# Patient Record
Sex: Female | Born: 1935
Health system: Southern US, Community
[De-identification: ages and names within clinical notes are randomized; demographics above are authoritative.]

## PROBLEM LIST (undated history)

## (undated) ENCOUNTER — Emergency Department (HOSPITAL_COMMUNITY): Payer: Medicare HMO

## (undated) DIAGNOSIS — N183 Chronic kidney disease, stage 3 (moderate): Secondary | ICD-10-CM

## (undated) DIAGNOSIS — H3530 Unspecified macular degeneration: Secondary | ICD-10-CM

## (undated) DIAGNOSIS — I10 Essential (primary) hypertension: Secondary | ICD-10-CM

## (undated) DIAGNOSIS — E079 Disorder of thyroid, unspecified: Secondary | ICD-10-CM

## (undated) HISTORY — DX: Disorder of thyroid, unspecified: E07.9

## (undated) HISTORY — DX: Essential (primary) hypertension: I10

## (undated) HISTORY — DX: Unspecified macular degeneration: H35.30

## (undated) HISTORY — PX: FRACTURE SURGERY: SHX138

## (undated) HISTORY — PX: ABDOMINAL HYSTERECTOMY: SHX81

## (undated) HISTORY — PX: EYE SURGERY: SHX253

## (undated) HISTORY — DX: Chronic kidney disease, stage 3 (moderate): N18.3

---

## 2013-10-22 DIAGNOSIS — M8580 Other specified disorders of bone density and structure, unspecified site: Secondary | ICD-10-CM | POA: Insufficient documentation

## 2015-11-02 DIAGNOSIS — K909 Intestinal malabsorption, unspecified: Secondary | ICD-10-CM | POA: Insufficient documentation

## 2015-11-02 DIAGNOSIS — R197 Diarrhea, unspecified: Secondary | ICD-10-CM | POA: Insufficient documentation

## 2016-03-13 DIAGNOSIS — R630 Anorexia: Secondary | ICD-10-CM | POA: Diagnosis not present

## 2016-03-13 DIAGNOSIS — R718 Other abnormality of red blood cells: Secondary | ICD-10-CM | POA: Diagnosis not present

## 2016-03-13 DIAGNOSIS — R432 Parageusia: Secondary | ICD-10-CM | POA: Diagnosis not present

## 2016-03-13 DIAGNOSIS — E039 Hypothyroidism, unspecified: Secondary | ICD-10-CM | POA: Diagnosis not present

## 2016-03-13 DIAGNOSIS — L659 Nonscarring hair loss, unspecified: Secondary | ICD-10-CM | POA: Diagnosis not present

## 2016-03-13 DIAGNOSIS — Z1231 Encounter for screening mammogram for malignant neoplasm of breast: Secondary | ICD-10-CM | POA: Diagnosis not present

## 2016-03-13 DIAGNOSIS — R5383 Other fatigue: Secondary | ICD-10-CM | POA: Diagnosis not present

## 2016-04-20 DIAGNOSIS — Z1231 Encounter for screening mammogram for malignant neoplasm of breast: Secondary | ICD-10-CM | POA: Diagnosis not present

## 2016-07-03 DIAGNOSIS — H04123 Dry eye syndrome of bilateral lacrimal glands: Secondary | ICD-10-CM | POA: Diagnosis not present

## 2016-07-03 DIAGNOSIS — Z961 Presence of intraocular lens: Secondary | ICD-10-CM | POA: Diagnosis not present

## 2016-07-03 DIAGNOSIS — H52223 Regular astigmatism, bilateral: Secondary | ICD-10-CM | POA: Diagnosis not present

## 2016-07-03 DIAGNOSIS — H353130 Nonexudative age-related macular degeneration, bilateral, stage unspecified: Secondary | ICD-10-CM | POA: Diagnosis not present

## 2016-07-03 DIAGNOSIS — H5213 Myopia, bilateral: Secondary | ICD-10-CM | POA: Diagnosis not present

## 2016-07-03 DIAGNOSIS — H26493 Other secondary cataract, bilateral: Secondary | ICD-10-CM | POA: Diagnosis not present

## 2016-10-24 DIAGNOSIS — I10 Essential (primary) hypertension: Secondary | ICD-10-CM | POA: Diagnosis not present

## 2016-10-24 DIAGNOSIS — E7800 Pure hypercholesterolemia, unspecified: Secondary | ICD-10-CM | POA: Diagnosis not present

## 2016-10-24 DIAGNOSIS — E039 Hypothyroidism, unspecified: Secondary | ICD-10-CM | POA: Diagnosis not present

## 2016-10-30 DIAGNOSIS — I10 Essential (primary) hypertension: Secondary | ICD-10-CM | POA: Diagnosis not present

## 2016-10-30 DIAGNOSIS — Z0000 Encounter for general adult medical examination without abnormal findings: Secondary | ICD-10-CM | POA: Diagnosis not present

## 2016-10-31 DIAGNOSIS — H353131 Nonexudative age-related macular degeneration, bilateral, early dry stage: Secondary | ICD-10-CM | POA: Diagnosis not present

## 2016-10-31 DIAGNOSIS — H04123 Dry eye syndrome of bilateral lacrimal glands: Secondary | ICD-10-CM | POA: Diagnosis not present

## 2016-10-31 DIAGNOSIS — Z961 Presence of intraocular lens: Secondary | ICD-10-CM | POA: Diagnosis not present

## 2016-10-31 DIAGNOSIS — H26493 Other secondary cataract, bilateral: Secondary | ICD-10-CM | POA: Diagnosis not present

## 2016-12-01 DIAGNOSIS — R69 Illness, unspecified: Secondary | ICD-10-CM | POA: Diagnosis not present

## 2016-12-11 DIAGNOSIS — Z23 Encounter for immunization: Secondary | ICD-10-CM | POA: Diagnosis not present

## 2016-12-17 DIAGNOSIS — L570 Actinic keratosis: Secondary | ICD-10-CM | POA: Diagnosis not present

## 2016-12-17 DIAGNOSIS — D2372 Other benign neoplasm of skin of left lower limb, including hip: Secondary | ICD-10-CM | POA: Diagnosis not present

## 2016-12-17 DIAGNOSIS — L905 Scar conditions and fibrosis of skin: Secondary | ICD-10-CM | POA: Diagnosis not present

## 2016-12-17 DIAGNOSIS — D1801 Hemangioma of skin and subcutaneous tissue: Secondary | ICD-10-CM | POA: Diagnosis not present

## 2016-12-17 DIAGNOSIS — L814 Other melanin hyperpigmentation: Secondary | ICD-10-CM | POA: Diagnosis not present

## 2016-12-17 DIAGNOSIS — L821 Other seborrheic keratosis: Secondary | ICD-10-CM | POA: Diagnosis not present

## 2016-12-26 DIAGNOSIS — Z23 Encounter for immunization: Secondary | ICD-10-CM | POA: Diagnosis not present

## 2017-01-07 DIAGNOSIS — H353132 Nonexudative age-related macular degeneration, bilateral, intermediate dry stage: Secondary | ICD-10-CM | POA: Diagnosis not present

## 2017-04-23 DIAGNOSIS — Z9981 Dependence on supplemental oxygen: Secondary | ICD-10-CM | POA: Diagnosis not present

## 2017-04-23 DIAGNOSIS — Z8349 Family history of other endocrine, nutritional and metabolic diseases: Secondary | ICD-10-CM | POA: Diagnosis not present

## 2017-04-23 DIAGNOSIS — Z6835 Body mass index (BMI) 35.0-35.9, adult: Secondary | ICD-10-CM | POA: Diagnosis not present

## 2017-04-23 DIAGNOSIS — Z7982 Long term (current) use of aspirin: Secondary | ICD-10-CM | POA: Diagnosis not present

## 2017-04-23 DIAGNOSIS — E669 Obesity, unspecified: Secondary | ICD-10-CM | POA: Diagnosis not present

## 2017-04-23 DIAGNOSIS — Z823 Family history of stroke: Secondary | ICD-10-CM | POA: Diagnosis not present

## 2017-04-23 DIAGNOSIS — Z8249 Family history of ischemic heart disease and other diseases of the circulatory system: Secondary | ICD-10-CM | POA: Diagnosis not present

## 2017-04-23 DIAGNOSIS — I10 Essential (primary) hypertension: Secondary | ICD-10-CM | POA: Diagnosis not present

## 2017-04-23 DIAGNOSIS — E039 Hypothyroidism, unspecified: Secondary | ICD-10-CM | POA: Diagnosis not present

## 2017-05-05 DIAGNOSIS — I10 Essential (primary) hypertension: Secondary | ICD-10-CM | POA: Diagnosis not present

## 2017-05-05 DIAGNOSIS — T7840XA Allergy, unspecified, initial encounter: Secondary | ICD-10-CM | POA: Diagnosis not present

## 2017-05-14 DIAGNOSIS — H353131 Nonexudative age-related macular degeneration, bilateral, early dry stage: Secondary | ICD-10-CM | POA: Diagnosis not present

## 2017-05-14 DIAGNOSIS — Z961 Presence of intraocular lens: Secondary | ICD-10-CM | POA: Diagnosis not present

## 2017-05-14 DIAGNOSIS — H26492 Other secondary cataract, left eye: Secondary | ICD-10-CM | POA: Diagnosis not present

## 2017-05-20 DIAGNOSIS — I10 Essential (primary) hypertension: Secondary | ICD-10-CM | POA: Diagnosis not present

## 2017-05-20 DIAGNOSIS — M25511 Pain in right shoulder: Secondary | ICD-10-CM | POA: Diagnosis not present

## 2017-05-20 DIAGNOSIS — M898X1 Other specified disorders of bone, shoulder: Secondary | ICD-10-CM | POA: Diagnosis not present

## 2017-05-20 DIAGNOSIS — M25819 Other specified joint disorders, unspecified shoulder: Secondary | ICD-10-CM | POA: Diagnosis not present

## 2017-05-20 DIAGNOSIS — M19011 Primary osteoarthritis, right shoulder: Secondary | ICD-10-CM | POA: Diagnosis not present

## 2017-09-22 DIAGNOSIS — L820 Inflamed seborrheic keratosis: Secondary | ICD-10-CM | POA: Diagnosis not present

## 2017-09-22 DIAGNOSIS — L244 Irritant contact dermatitis due to drugs in contact with skin: Secondary | ICD-10-CM | POA: Diagnosis not present

## 2017-09-22 DIAGNOSIS — D485 Neoplasm of uncertain behavior of skin: Secondary | ICD-10-CM | POA: Diagnosis not present

## 2017-09-22 DIAGNOSIS — D0472 Carcinoma in situ of skin of left lower limb, including hip: Secondary | ICD-10-CM | POA: Diagnosis not present

## 2017-09-22 DIAGNOSIS — L281 Prurigo nodularis: Secondary | ICD-10-CM | POA: Diagnosis not present

## 2017-10-13 DIAGNOSIS — D2271 Melanocytic nevi of right lower limb, including hip: Secondary | ICD-10-CM | POA: Diagnosis not present

## 2017-10-13 DIAGNOSIS — D225 Melanocytic nevi of trunk: Secondary | ICD-10-CM | POA: Diagnosis not present

## 2017-10-13 DIAGNOSIS — L570 Actinic keratosis: Secondary | ICD-10-CM | POA: Diagnosis not present

## 2017-10-13 DIAGNOSIS — D2262 Melanocytic nevi of left upper limb, including shoulder: Secondary | ICD-10-CM | POA: Diagnosis not present

## 2017-10-13 DIAGNOSIS — C44722 Squamous cell carcinoma of skin of right lower limb, including hip: Secondary | ICD-10-CM | POA: Diagnosis not present

## 2017-10-13 DIAGNOSIS — C44729 Squamous cell carcinoma of skin of left lower limb, including hip: Secondary | ICD-10-CM | POA: Diagnosis not present

## 2017-10-13 DIAGNOSIS — D2261 Melanocytic nevi of right upper limb, including shoulder: Secondary | ICD-10-CM | POA: Diagnosis not present

## 2017-10-13 DIAGNOSIS — D485 Neoplasm of uncertain behavior of skin: Secondary | ICD-10-CM | POA: Diagnosis not present

## 2017-10-13 DIAGNOSIS — X32XXXA Exposure to sunlight, initial encounter: Secondary | ICD-10-CM | POA: Diagnosis not present

## 2017-10-13 DIAGNOSIS — D2272 Melanocytic nevi of left lower limb, including hip: Secondary | ICD-10-CM | POA: Diagnosis not present

## 2017-10-20 DIAGNOSIS — D0472 Carcinoma in situ of skin of left lower limb, including hip: Secondary | ICD-10-CM | POA: Diagnosis not present

## 2017-10-20 DIAGNOSIS — D0471 Carcinoma in situ of skin of right lower limb, including hip: Secondary | ICD-10-CM | POA: Diagnosis not present

## 2017-12-23 DIAGNOSIS — H43813 Vitreous degeneration, bilateral: Secondary | ICD-10-CM | POA: Diagnosis not present

## 2017-12-23 DIAGNOSIS — H353112 Nonexudative age-related macular degeneration, right eye, intermediate dry stage: Secondary | ICD-10-CM | POA: Diagnosis not present

## 2017-12-24 ENCOUNTER — Ambulatory Visit (INDEPENDENT_AMBULATORY_CARE_PROVIDER_SITE_OTHER): Payer: Medicare HMO | Admitting: Physician Assistant

## 2017-12-24 ENCOUNTER — Encounter: Payer: Self-pay | Admitting: Physician Assistant

## 2017-12-24 VITALS — BP 122/82 | HR 62 | Temp 98.5°F | Resp 16 | Ht 61.0 in | Wt 174.0 lb

## 2017-12-24 DIAGNOSIS — E039 Hypothyroidism, unspecified: Secondary | ICD-10-CM | POA: Diagnosis not present

## 2017-12-24 DIAGNOSIS — Z23 Encounter for immunization: Secondary | ICD-10-CM | POA: Diagnosis not present

## 2017-12-24 DIAGNOSIS — I10 Essential (primary) hypertension: Secondary | ICD-10-CM

## 2017-12-24 MED ORDER — LISINOPRIL-HYDROCHLOROTHIAZIDE 20-12.5 MG PO TABS: 1.0000 | ORAL_TABLET | Freq: Every day | ORAL | 0 refills | Status: DC

## 2017-12-24 NOTE — Progress Notes (Signed)
Patient: Brenda Horton, Female    DOB: May 16, 1929, 84 y.o.   MRN: [id number] Visit Date: 03/09/2018  Today's Provider: Trinna Post, PA-C   Chief Complaint  Patient presents with  . Hypertension  . Hypothyroidism   Subjective:  HPI     New Patient: Brenda Horton is a 84 y.o. female who presents today for new patient appointment. She feels well. The patient has history of hypertension and hypothyroidism. She reports exercising walking. She reports she is sleeping well.  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 Denies fatigue, constipation, weight loss/gain, heat/cold intolerance.   HTN: Takes lisinopril HCTZ 20-12.5 mg daily.  BP Readings from Last 3 Encounters:  03/02/18 135/75  01/13/18 (!) 132/58  12/24/17 122/82      -----------------------------------------------------------------   Review of Systems  Social History She  reports that she has never smoked. She has never used smokeless tobacco. She reports current alcohol use. She reports that she does not use drugs. Social History   Socioeconomic History  . Marital status: Widowed    Spouse name: Not on file  . Number of children: 5  . Years of education: Not on file  . Highest education level: Some college, no degree  Occupational History  . Occupation: retired  Scientific laboratory technician  . Financial resource strain: Not hard at all  . Food insecurity:    Worry: Never true    Inability: Never true  . Transportation needs:    Medical: No    Non-medical: No  Tobacco Use  . Smoking status: Never Smoker  . Smokeless tobacco: Never Used  Substance and Sexual Activity  . Alcohol use: Yes    Frequency: Never    Comment: maybe 2 glasses a month  . Drug use: Never  . Sexual activity: Not on file    Lifestyle  . Physical activity:    Days per week: 0 days    Minutes per session: 0 min  . Stress: Not at all  Relationships  . Social connections:    Talks on phone: Patient refused    Gets together: Patient refused    Attends religious service: Patient refused    Active member of club or organization: Patient refused    Attends meetings of clubs or organizations: Patient refused    Relationship status: Patient refused  Other Topics Concern  . Not on file  Social History Narrative  . Not on file    Patient Active Problem List   Diagnosis Date Noted  . Hypertension 03/09/2018  . Hypothyroidism 03/09/2018    Family History  Family Status  Relation Name Status  . Mother  Deceased  . Father  Deceased  . Daughter  Alive  . Son  Alive  . Son  Alive  . Son  Alive  . Son  Alive   Her family history includes Arthritis in her daughter; Hypertension in her father; Hyperthyroidism in her daughter, mother, and son; Rheum arthritis in her daughter.     No Known Allergies  Previous Medications   ASPIRIN 81 MG TABLET    Take 81 mg by mouth daily.    BIOTIN 2500 MCG CAPS    Take 7,500 mcg by mouth daily.   COENZYME Q10 10 MG CAPSULE  Take 10 mg by mouth daily.    LEVOTHYROXINE (SYNTHROID, LEVOTHROID) 112 MCG TABLET    Take 112 mcg by mouth daily before breakfast.   MULTIPLE VITAMIN (MULTIVITAMIN) CAPSULE    Take 1 capsule by mouth daily.    MULTIPLE VITAMINS-MINERALS (PRESERVISION AREDS 2) CAPS    Take 2 capsules by mouth daily.    PSYLLIUM (METAMUCIL) 58.6 % PACKET    Take 1 packet by mouth daily.     Patient Care Team: Paulene Floor as PCP - General (Physician Assistant) Isaias Sakai, MD as Referring Physician (Ophthalmology)      Objective:   Vitals: BP 122/82 (BP Location: Left Arm, Patient Position: Sitting, Cuff Size: Normal)   Pulse 62   Temp 98.5 F (36.9 C) (Oral)   Resp 16   Ht 5\' 1"  (1.549 m)   Wt 174 lb (78.9 kg)   SpO2 97%   BMI 32.88 kg/m     Physical Exam Constitutional:      Appearance: Normal appearance.  Cardiovascular:     Rate and Rhythm: Normal rate and regular rhythm.  Pulmonary:     Effort: Pulmonary effort is normal.  Skin:    General: Skin is warm and dry.  Neurological:     Mental Status: She is alert.  Psychiatric:        Mood and Affect: Mood normal.      Depression Screen PHQ 2/9 Scores 01/13/2018 12/24/2017 12/24/2017  PHQ - 2 Score 0 0 0      Assessment & Plan:     Routine Health Maintenance and Physical Exam  Exercise Activities and Dietary recommendations Goals    . Exercise 3x per week (30 min per time)     Recommend to start walking 3 days a week for at least 30 minutes at a time.        Immunization History  Administered Date(s) Administered  . Influenza, High Dose Seasonal PF 12/14/2012, 11/24/2014, 12/11/2015, 12/11/2016, 12/24/2017  . Influenza, Seasonal, Injecte, Preservative Fre 12/09/2013  . Pneumococcal Conjugate-13 10/18/2014  . Pneumococcal Polysaccharide-23 03/12/1999  . Tdap 12/26/2016  . Zoster 03/11/2005    Health Maintenance  Topic Date Due  . DEXA SCAN  11/05/2020  . TETANUS/TDAP  12/27/2026  . INFLUENZA VACCINE  Completed  . PNA vac Low Risk Adult  Completed     Discussed health benefits of physical activity, and encouraged her to engage in regular exercise appropriate for her age and condition.    1. Hypertension, unspecified type  - lisinopril-hydrochlorothiazide (ZESTORETIC) 20-12.5 MG tablet; Take 1 tablet by mouth daily.  Dispense: 90 tablet; Refill: 0 - Comprehensive Metabolic Panel (CMET) - Lipid Profile - CBC with Differential  2. Hypothyroidism, unspecified type  Refill synthroid pending lab work.   - TSH  3. Need for influenza vaccination  - Flu vaccine HIGH DOSE PF  Return in about 6 months (around 06/25/2018) for htn f/u and awv .   --------------------------------------------------------------------

## 2017-12-25 LAB — CBC WITH DIFFERENTIAL/PLATELET
Basophils Absolute: 0.1 10*3/uL (ref 0.0–0.2)
Basos: 1 %
EOS (ABSOLUTE): 0.3 10*3/uL (ref 0.0–0.4)
Eos: 4 %
Hematocrit: 39.1 % (ref 34.0–46.6)
Hemoglobin: 12.9 g/dL (ref 11.1–15.9)
Immature Grans (Abs): 0.1 10*3/uL (ref 0.0–0.1)
Immature Granulocytes: 2 %
Lymphocytes Absolute: 2.2 10*3/uL (ref 0.7–3.1)
Lymphs: 39 %
MCH: 33.3 pg — ABNORMAL HIGH (ref 26.6–33.0)
MCHC: 33 g/dL (ref 31.5–35.7)
MCV: 101 fL — ABNORMAL HIGH (ref 79–97)
Monocytes Absolute: 0.7 10*3/uL (ref 0.1–0.9)
Monocytes: 12 %
Neutrophils Absolute: 2.4 10*3/uL (ref 1.4–7.0)
Neutrophils: 42 %
Platelets: 228 10*3/uL (ref 150–450)
RBC: 3.87 x10E6/uL (ref 3.77–5.28)
RDW: 13.9 % (ref 12.3–15.4)
WBC: 5.7 10*3/uL (ref 3.4–10.8)

## 2017-12-25 LAB — COMPREHENSIVE METABOLIC PANEL
ALT: 16 IU/L (ref 0–32)
AST: 17 IU/L (ref 0–40)
Albumin/Globulin Ratio: 2 (ref 1.2–2.2)
Albumin: 4.6 g/dL (ref 3.5–4.7)
Alkaline Phosphatase: 27 IU/L — ABNORMAL LOW (ref 39–117)
BUN/Creatinine Ratio: 29 — ABNORMAL HIGH (ref 12–28)
BUN: 29 mg/dL — ABNORMAL HIGH (ref 8–27)
Bilirubin Total: 0.5 mg/dL (ref 0.0–1.2)
CO2: 24 mmol/L (ref 20–29)
Calcium: 9.6 mg/dL (ref 8.7–10.3)
Chloride: 103 mmol/L (ref 96–106)
Creatinine, Ser: 1 mg/dL (ref 0.57–1.00)
GFR calc Af Amer: 59 mL/min/{1.73_m2} — ABNORMAL LOW (ref >59)
GFR calc non Af Amer: 51 mL/min/{1.73_m2} — ABNORMAL LOW (ref >59)
Globulin, Total: 2.3 g/dL (ref 1.5–4.5)
Glucose: 93 mg/dL (ref 65–99)
Potassium: 4.3 mmol/L (ref 3.5–5.2)
Sodium: 142 mmol/L (ref 134–144)
Total Protein: 6.9 g/dL (ref 6.0–8.5)

## 2017-12-25 LAB — LIPID PANEL
Chol/HDL Ratio: 3 ratio (ref 0.0–4.4)
Cholesterol, Total: 196 mg/dL (ref 100–199)
HDL: 66 mg/dL (ref >39)
LDL Calculated: 106 mg/dL — ABNORMAL HIGH (ref 0–99)
Triglycerides: 119 mg/dL (ref 0–149)
VLDL Cholesterol Cal: 24 mg/dL (ref 5–40)

## 2017-12-25 LAB — TSH: TSH: 0.909 u[IU]/mL (ref 0.450–4.500)

## 2018-01-08 ENCOUNTER — Telehealth: Payer: Self-pay

## 2018-01-08 NOTE — Telephone Encounter (Signed)
Patient advised as below. Will wait to have labs rechecked till April.

## 2018-01-08 NOTE — Telephone Encounter (Signed)
-----   Message from Trinna Post, [place] sent at 01/08/2018 11:08 AM EDT ----- TSH normal. Kidney function slightly reduced but stable. Cholesterol well controlled. CBC shows slightly large blood cells which have been present for 4 years base on her previous labwork. Sometimes this can be due to vitamin deficiencies. Her B12 was normal last year, folate not tested. Would she like me to test again? If so can place labwork upfront for her to come get.

## 2018-01-08 NOTE — Telephone Encounter (Signed)
na

## 2018-01-09 ENCOUNTER — Telehealth: Payer: Self-pay | Admitting: Physician Assistant

## 2018-01-09 NOTE — Telephone Encounter (Signed)
Pt called wanting Adriana to call her back to talk about her blood pressure medication and if it will effect her kidneys  CB#  [phone]  Thanks  teri

## 2018-01-09 NOTE — Telephone Encounter (Signed)
The medication she is on can sometimes affect her kidneys but I have reviewed her labwork of her kidney function as far back as 2013 and it is stable so I do not think this is the cause.

## 2018-01-12 NOTE — Telephone Encounter (Signed)
Patient advised as below.  

## 2018-01-13 ENCOUNTER — Ambulatory Visit: Payer: Medicare HMO

## 2018-01-13 VITALS — BP 132/58 | HR 68 | Temp 97.9°F | Ht 61.0 in | Wt 180.0 lb

## 2018-01-13 DIAGNOSIS — Z0000 Encounter for general adult medical examination without abnormal findings: Secondary | ICD-10-CM | POA: Diagnosis not present

## 2018-01-13 NOTE — Progress Notes (Signed)
Subjective:   Brenda Horton is a 84 y.o. female who presents for an Initial Medicare Annual Wellness Visit.  Review of Systems    N/A  Cardiac Risk Factors include: advanced age (>52men, >73 women);hypertension;obesity (BMI >30kg/m2)     Objective:    Today's Vitals   01/13/18 0833 01/13/18 0839  BP: (!) 132/58   Pulse: 68   Temp: 97.9 F (36.6 C)   TempSrc: Oral   Weight: 180 lb (81.6 kg)   Height: 5\' 1"  (1.549 m)   PainSc: 0-No pain 0-No pain   Body mass index is 34.01 kg/m.  Advanced Directives 01/13/2018  Does Patient Have a Medical Advance Directive? Yes  Type of Paramedic of [place];Living will  Copy of [place] in Chart? Yes    Current Medications (verified) Outpatient Encounter Medications as of 01/13/2018  Medication Sig  . aspirin 81 MG tablet Take 81 mg by mouth daily.   . Biotin 2500 MCG CAPS Take 7,500 mcg by mouth daily.  . Coenzyme Q10 10 MG capsule Take 10 mg by mouth daily.   Marland Kitchen levothyroxine (SYNTHROID, LEVOTHROID) 112 MCG tablet Take 112 mcg by mouth daily before breakfast.  . lisinopril-hydrochlorothiazide (ZESTORETIC) 20-12.5 MG tablet Take 1 tablet by mouth daily.  . Multiple Vitamin (MULTIVITAMIN) capsule Take 1 capsule by mouth daily.   . Multiple Vitamins-Minerals (PRESERVISION AREDS 2) CAPS Take 2 capsules by mouth daily.   . psyllium (METAMUCIL) 58.6 % packet Take 1 packet by mouth daily.    No facility-administered encounter medications on file as of 01/13/2018.     Allergies (verified) Patient has no known allergies.   History: Past Medical History:  Diagnosis Date  . Hypertension   . Macular degeneration   . Thyroid disease    Past Surgical History:  Procedure Laterality Date  . ABDOMINAL HYSTERECTOMY     Partial  . EYE SURGERY    . FRACTURE SURGERY     Family History  Problem Relation Age of Onset  . Hyperthyroidism Mother   . Hypertension Father   . Hyperthyroidism  Daughter   . Arthritis Daughter   . Rheum arthritis Daughter   . Hyperthyroidism Son    Social History   Socioeconomic History  . Marital status: Widowed    Spouse name: Not on file  . Number of children: 5  . Years of education: Not on file  . Highest education level: Some college, no degree  Occupational History  . Occupation: retired  Scientific laboratory technician  . Financial resource strain: Not hard at all  . Food insecurity:    Worry: Never true    Inability: Never true  . Transportation needs:    Medical: No    Non-medical: No  Tobacco Use  . Smoking status: Never Smoker  . Smokeless tobacco: Never Used  Substance and Sexual Activity  . Alcohol use: Yes    Frequency: Never    Comment: maybe 2 glasses a month  . Drug use: Never  . Sexual activity: Not on file  Lifestyle  . Physical activity:    Days per week: 0 days    Minutes per session: 0 min  . Stress: Not at all  Relationships  . Social connections:    Talks on phone: Patient refused    Gets together: Patient refused    Attends religious service: Patient refused    Active member of club or organization: Patient refused    Attends meetings of clubs or organizations:  Patient refused    Relationship status: Patient refused  Other Topics Concern  . Not on file  Social History Narrative  . Not on file    Tobacco Counseling Counseling given: Not Answered   Clinical Intake:  Pre-visit preparation completed: Yes  Pain : No/denies pain Pain Score: 0-No pain     Nutritional Status: BMI > 30  Obese Nutritional Risks: None Diabetes: No  How often do you need to have someone help you when you read instructions, pamphlets, or other written materials from your doctor or pharmacy?: 1 - Never  Interpreter Needed?: No  Information entered by :: [facility], LPN   Activities of Daily Living In your present state of health, do you have any difficulty performing the following activities: 01/13/2018 12/24/2017  Hearing?  N N  Vision? N N  Difficulty concentrating or making decisions? N N  Walking or climbing stairs? N N  Dressing or bathing? N N  Doing errands, shopping? N N  Preparing Food and eating ? N -  Using the Toilet? N -  In the past six months, have you accidently leaked urine? Y -  Comment Occasionally, wears pads for protection.  -  Do you have problems with loss of bowel control? N -  Managing your Medications? N -  Managing your Finances? N -  Housekeeping or managing your Housekeeping? N -     Immunizations and Health Maintenance Immunization History  Administered Date(s) Administered  . Influenza, High Dose Seasonal PF 12/14/2012, 11/24/2014, 12/11/2015, 12/11/2016, 12/24/2017  . Influenza, Seasonal, Injecte, Preservative Fre 12/09/2013  . Pneumococcal Conjugate-13 10/18/2014  . Pneumococcal Polysaccharide-23 03/12/1999  . Tdap 12/26/2016  . Zoster 03/11/2005   There are no preventive care reminders to display for this patient.  Patient Care Team: Paulene Floor as PCP - General (Physician Assistant) Isaias Sakai, MD as Referring Physician (Ophthalmology)  Indicate any recent Medical Services you may have received from other than [facility] providers in the past year (date may be approximate).     Assessment:   This is a routine wellness examination for [place].  Hearing/Vision screen No exam data present  Dietary issues and exercise activities discussed: Current Exercise Habits: The patient does not participate in regular exercise at present, Exercise limited by: None identified  Goals    . Exercise 3x per week (30 min per time)     Recommend to start walking 3 days a week for at least 30 minutes at a time.       Depression Screen PHQ 2/9 Scores 01/13/2018 12/24/2017 12/24/2017  PHQ - 2 Score 0 0 0    Fall Risk Fall Risk  01/13/2018 12/24/2017 12/24/2017  Falls in the past year? 0 No No    FALL RISK PREVENTION PERTAINING TO THE HOME:  Any stairs in or  around the home WITH handrails? No  Home free of loose throw rugs in walkways, pet beds, electrical cords, etc? Yes  Adequate lighting in your home to reduce risk of falls? Yes   ASSISTIVE DEVICES UTILIZED TO PREVENT FALLS:  Life alert? No  Use of a cane, walker or w/c? No  Grab bars in the bathroom? Yes  Shower chair or bench in shower? No  Elevated toilet seat or a handicapped toilet? Yes    TIMED UP AND GO:  Was the test performed? No .     Cognitive Function: Declined today.         Screening Tests Health Maintenance  Topic Date Due  .  DEXA SCAN  11/05/2020  . TETANUS/TDAP  12/27/2026  . INFLUENZA VACCINE  Completed  . PNA vac Low Risk Adult  Completed    Qualifies for Shingles Vaccine? Yes  Zostavax completed in 2007. Due for Shingrix. Education has been provided regarding the importance of this vaccine. Pt has been advised to call insurance company to determine out of pocket expense. Advised may also receive vaccine at local pharmacy or Health Dept. Verbalized acceptance and understanding.  Tdap: Up to date  Flu Vaccine: Up to date  Pneumococcal Vaccine: Up to date   Cancer Screenings:  Colorectal Screening: No longer required.  Mammogram: No longer required.  Bone Density: Completed 11/06/15. Results reflect OSTEOPENIA. Repeat every 5 years.   Lung Cancer Screening: (Low Dose CT Chest recommended if Age 54-80 years, 30 pack-year currently smoking OR have quit w/in 15years.) does not qualify.    Additional Screening:  Vision Screening: Recommended annual ophthalmology exams for early detection of glaucoma and other disorders of the eye. Dental Screening: Recommended annual dental exams for proper oral hygiene  Community Resource Referral:  CRR required this visit?  No       Plan:  I have personally reviewed and addressed the Medicare Annual Wellness questionnaire and have noted the following in the patient's chart:  A. Medical and social  history B. Use of alcohol, tobacco or illicit drugs  C. Current medications and supplements D. Functional ability and status E.  Nutritional status F.  Physical activity G. Advance directives H. List of other physicians I.  Hospitalizations, surgeries, and ER visits in previous 12 months J.  [place] such as hearing and vision if needed, cognitive and depression L. Referrals and appointments - none  In addition, I have reviewed and discussed with patient certain preventive protocols, quality metrics, and best practice recommendations. A written personalized care plan for preventive services as well as general preventive health recommendations were provided to patient.  See attached scanned questionnaire for additional information.   Signed,  Fabio Neighbors, LPN Nurse Health Advisor   Nurse Recommendations: None.

## 2018-01-13 NOTE — Patient Instructions (Addendum)
Brenda Horton , Thank you for taking time to come for your Medicare Wellness Visit. I appreciate your ongoing commitment to your health goals. Please review the following plan we discussed and let me know if I can assist you in the future.   Screening recommendations/referrals: Colonoscopy: N/A Mammogram: N/A Bone Density: Up to date Recommended yearly ophthalmology/optometry visit for glaucoma screening and checkup Recommended yearly dental visit for hygiene and checkup  Vaccinations: Influenza vaccine: Up to date Pneumococcal vaccine: Up to date Tdap vaccine: Up to date Shingles vaccine: Pt declines today.     Advanced directives: Copied into file today.   Conditions/risks identified: Obesity- recommend to start walking 3 days a week for at least 30 minutes at a time.   Next appointment: 06/24/17 with Carles Collet. Pt to CB and schedule a CPE this year.    Preventive Care 82 Years and Older, Female Preventive care refers to lifestyle choices and visits with your health care provider that can promote health and wellness. What does preventive care include?  A yearly physical exam. This is also called an annual well check.  Dental exams once or twice a year.  Routine eye exams. Ask your health care provider how often you should have your eyes checked.  Personal lifestyle choices, including:  Daily care of your teeth and gums.  Regular physical activity.  Eating a healthy diet.  Avoiding tobacco and drug use.  Limiting alcohol use.  Practicing safe sex.  Taking low-dose aspirin every day.  Taking vitamin and mineral supplements as recommended by your health care provider. What happens during an annual well check? The services and screenings done by your health care provider during your annual well check will depend on your age, overall health, lifestyle risk factors, and family history of disease. Counseling  Your health care provider may ask you questions about  your:  Alcohol use.  Tobacco use.  Drug use.  Emotional well-being.  Home and relationship well-being.  Sexual activity.  Eating habits.  History of falls.  Memory and ability to understand (cognition).  Work and work Statistician.  Reproductive health. Screening  You may have the following tests or measurements:  Height, weight, and BMI.  Blood pressure.  Lipid and cholesterol levels. These may be checked every 5 years, or more frequently if you are over 27 years old.  Skin check.  Lung cancer screening. You may have this screening every year starting at age 23 if you have a 30-pack-year history of smoking and currently smoke or have quit within the past 15 years.  Fecal occult blood test (FOBT) of the stool. You may have this test every year starting at age 28.  Flexible sigmoidoscopy or colonoscopy. You may have a sigmoidoscopy every 5 years or a colonoscopy every 10 years starting at age 12.  Hepatitis C blood test.  Hepatitis B blood test.  Sexually transmitted disease (STD) testing.  Diabetes screening. This is done by checking your blood sugar (glucose) after you have not eaten for a while (fasting). You may have this done every 1-3 years.  Bone density scan. This is done to screen for osteoporosis. You may have this done starting at age 6.  Mammogram. This may be done every 1-2 years. Talk to your health care provider about how often you should have regular mammograms. Talk with your health care provider about your test results, treatment options, and if necessary, the need for more tests. Vaccines  Your health care provider may recommend certain vaccines,  such as:  Influenza vaccine. This is recommended every year.  Tetanus, diphtheria, and acellular pertussis (Tdap, Td) vaccine. You may need a Td booster every 10 years.  Zoster vaccine. You may need this after age 72.  Pneumococcal 13-valent conjugate (PCV13) vaccine. One dose is recommended  after age 50.  Pneumococcal polysaccharide (PPSV23) vaccine. One dose is recommended after age 48. Talk to your health care provider about which screenings and vaccines you need and how often you need them. This information is not intended to replace advice given to you by your health care provider. Make sure you discuss any questions you have with your health care provider. Document Released: 03/24/2015 Document Revised: 11/15/2015 Document Reviewed: 12/27/2014 Elsevier Interactive Patient Education  2017 [place] Prevention in the Home Falls can cause injuries. They can happen to people of all ages. There are many things you can do to make your home safe and to help prevent falls. What can I do on the outside of my home?  Regularly fix the edges of walkways and driveways and fix any cracks.  Remove anything that might make you trip as you walk through a door, such as a raised step or threshold.  Trim any bushes or trees on the path to your home.  Use bright outdoor lighting.  Clear any walking paths of anything that might make someone trip, such as rocks or tools.  Regularly check to see if handrails are loose or broken. Make sure that both sides of any steps have handrails.  Any raised decks and porches should have guardrails on the edges.  Have any leaves, snow, or ice cleared regularly.  Use sand or salt on walking paths during winter.  Clean up any spills in your garage right away. This includes oil or grease spills. What can I do in the bathroom?  Use night lights.  Install grab bars by the toilet and in the tub and shower. Do not use towel bars as grab bars.  Use non-skid mats or decals in the tub or shower.  If you need to sit down in the shower, use a plastic, non-slip stool.  Keep the floor dry. Clean up any water that spills on the floor as soon as it happens.  Remove soap buildup in the tub or shower regularly.  Attach bath mats securely with  double-sided non-slip rug tape.  Do not have throw rugs and other things on the floor that can make you trip. What can I do in the bedroom?  Use night lights.  Make sure that you have a light by your bed that is easy to reach.  Do not use any sheets or blankets that are too big for your bed. They should not hang down onto the floor.  Have a firm chair that has side arms. You can use this for support while you get dressed.  Do not have throw rugs and other things on the floor that can make you trip. What can I do in the kitchen?  Clean up any spills right away.  Avoid walking on wet floors.  Keep items that you use a lot in easy-to-reach places.  If you need to reach something above you, use a strong step stool that has a grab bar.  Keep electrical cords out of the way.  Do not use floor polish or wax that makes floors slippery. If you must use wax, use non-skid floor wax.  Do not have throw rugs and other things on  the floor that can make you trip. What can I do with my stairs?  Do not leave any items on the stairs.  Make sure that there are handrails on both sides of the stairs and use them. Fix handrails that are broken or loose. Make sure that handrails are as long as the stairways.  Check any carpeting to make sure that it is firmly attached to the stairs. Fix any carpet that is loose or worn.  Avoid having throw rugs at the top or bottom of the stairs. If you do have throw rugs, attach them to the floor with carpet tape.  Make sure that you have a light switch at the top of the stairs and the bottom of the stairs. If you do not have them, ask someone to add them for you. What else can I do to help prevent falls?  Wear shoes that:  Do not have high heels.  Have rubber bottoms.  Are comfortable and fit you well.  Are closed at the toe. Do not wear sandals.  If you use a stepladder:  Make sure that it is fully opened. Do not climb a closed stepladder.  Make  sure that both sides of the stepladder are locked into place.  Ask someone to hold it for you, if possible.  Clearly mark and make sure that you can see:  Any grab bars or handrails.  First and last steps.  Where the edge of each step is.  Use tools that help you move around (mobility aids) if they are needed. These include:  Canes.  Walkers.  Scooters.  Crutches.  Turn on the lights when you go into a dark area. Replace any light bulbs as soon as they burn out.  Set up your furniture so you have a clear path. Avoid moving your furniture around.  If any of your floors are uneven, fix them.  If there are any pets around you, be aware of where they are.  Review your medicines with your doctor. Some medicines can make you feel dizzy. This can increase your chance of falling. Ask your doctor what other things that you can do to help prevent falls. This information is not intended to replace advice given to you by your health care provider. Make sure you discuss any questions you have with your health care provider. Document Released: 12/22/2008 Document Revised: 08/03/2015 Document Reviewed: 04/01/2014 Elsevier Interactive Patient Education  2017 [employer].

## 2018-01-14 ENCOUNTER — Ambulatory Visit: Payer: Self-pay | Admitting: Physician Assistant

## 2018-02-02 DIAGNOSIS — H04123 Dry eye syndrome of bilateral lacrimal glands: Secondary | ICD-10-CM | POA: Diagnosis not present

## 2018-03-02 ENCOUNTER — Encounter: Payer: Self-pay | Admitting: Physician Assistant

## 2018-03-02 ENCOUNTER — Ambulatory Visit (INDEPENDENT_AMBULATORY_CARE_PROVIDER_SITE_OTHER): Payer: Medicare HMO | Admitting: Physician Assistant

## 2018-03-02 VITALS — BP 135/75 | HR 71 | Temp 98.8°F | Wt 180.8 lb

## 2018-03-02 DIAGNOSIS — I10 Essential (primary) hypertension: Secondary | ICD-10-CM | POA: Diagnosis not present

## 2018-03-02 DIAGNOSIS — D7589 Other specified diseases of blood and blood-forming organs: Secondary | ICD-10-CM

## 2018-03-02 DIAGNOSIS — Z0000 Encounter for general adult medical examination without abnormal findings: Secondary | ICD-10-CM

## 2018-03-02 DIAGNOSIS — N183 Chronic kidney disease, stage 3 unspecified: Secondary | ICD-10-CM

## 2018-03-02 DIAGNOSIS — E039 Hypothyroidism, unspecified: Secondary | ICD-10-CM

## 2018-03-02 NOTE — Progress Notes (Signed)
Patient: Brenda Horton, Female    DOB: 08-09-1929, 84 y.o.   MRN: [id number] Visit Date: 03/09/2018  Today's Provider: Trinna Post, PA-C   Chief Complaint  Patient presents with  . Annual Exam   Subjective:   AWE with McKenzie was on 01/13/2018.   Annual wellness visit Brenda Horton is a 84 y.o. female. She feels well. She reports exercising includes walking. She reports she is sleeping well.  Declines DEXA: mammogram not indicated, colonoscopy not indicaed shingrix - never had shingles, she does remember having zostavax - will look into insurance ckd - htn kidney disease 2013: history of c diff infection and has had intermittent diarrhea since then.  Macrocytosis: Has a glass of wine occasionally during the week.   Lab Results  Component Value Date   WBC 7.0 03/02/2018   HGB 12.3 03/02/2018   HCT 36.8 03/02/2018   MCV 103 (H) 03/02/2018   PLT 195 03/02/2018    -----------------------------------------------------------   Review of Systems  Constitutional: Negative.   HENT: Positive for sinus pressure and tinnitus.   Eyes: Positive for photophobia.  Respiratory: Negative.   Cardiovascular: Negative.   Gastrointestinal: Positive for diarrhea.  Endocrine: Negative.   Genitourinary: Negative.   Musculoskeletal: Negative.   Skin: Negative.   Allergic/Immunologic: Negative.   Neurological: Negative.   Hematological: Negative.   Psychiatric/Behavioral: Negative.     Social History   Socioeconomic History  . Marital status: Widowed    Spouse name: Not on file  . Number of children: 5  . Years of education: Not on file  . Highest education level: Some college, no degree  Occupational History  . Occupation: retired  Scientific laboratory technician  . Financial resource strain: Not hard at all  . Food insecurity:    Worry: Never true    Inability: Never true  . Transportation needs:    Medical: No    Non-medical: No  Tobacco Use  . Smoking status: Never  Smoker  . Smokeless tobacco: Never Used  Substance and Sexual Activity  . Alcohol use: Yes    Frequency: Never    Comment: maybe 2 glasses a month  . Drug use: Never  . Sexual activity: Not on file  Lifestyle  . Physical activity:    Days per week: 0 days    Minutes per session: 0 min  . Stress: Not at all  Relationships  . Social connections:    Talks on phone: Patient refused    Gets together: Patient refused    Attends religious service: Patient refused    Active member of club or organization: Patient refused    Attends meetings of clubs or organizations: Patient refused    Relationship status: Patient refused  . Intimate partner violence:    Fear of current or ex partner: Patient refused    Emotionally abused: Patient refused    Physically abused: Patient refused    Forced sexual activity: Patient refused  Other Topics Concern  . Not on file  Social History Narrative  . Not on file    Past Medical History:  Diagnosis Date  . CKD (chronic kidney disease) stage 3, GFR 30-59 ml/min (HCC) 03/09/2018  . Hypertension   . Macular degeneration   . Thyroid disease      Patient Active Problem List   Diagnosis Date Noted  . Hypertension 03/09/2018  . Hypothyroidism 03/09/2018  . CKD (chronic kidney disease) stage 3, GFR 30-59 ml/min (HCC) 03/09/2018    Past  Surgical History:  Procedure Laterality Date  . ABDOMINAL HYSTERECTOMY     Partial  . EYE SURGERY    . FRACTURE SURGERY      Her family history includes Arthritis in her daughter; Hypertension in her father; Hyperthyroidism in her daughter, mother, and son; Rheum arthritis in her daughter.      Current Outpatient Medications:  .  aspirin 81 MG tablet, Take 81 mg by mouth daily. , Disp: , Rfl:  .  Biotin 2500 MCG CAPS, Take 7,500 mcg by mouth daily., Disp: , Rfl:  .  Coenzyme Q10 10 MG capsule, Take 10 mg by mouth daily. , Disp: , Rfl:  .  levothyroxine (SYNTHROID, LEVOTHROID) 112 MCG tablet, Take 112 mcg by  mouth daily before breakfast., Disp: , Rfl:  .  lisinopril-hydrochlorothiazide (ZESTORETIC) 20-12.5 MG tablet, Take 1 tablet by mouth daily., Disp: 90 tablet, Rfl: 0 .  Multiple Vitamin (MULTIVITAMIN) capsule, Take 1 capsule by mouth daily. , Disp: , Rfl:  .  Multiple Vitamins-Minerals (PRESERVISION AREDS 2) CAPS, Take 2 capsules by mouth daily. , Disp: , Rfl:  .  psyllium (METAMUCIL) 58.6 % packet, Take 1 packet by mouth daily. , Disp: , Rfl:   Patient Care Team: Paulene Floor as PCP - General (Physician Assistant) Isaias Sakai, MD as Referring Physician (Ophthalmology)     Objective:   Vitals: BP 135/75 (BP Location: Left Arm, Patient Position: Sitting, Cuff Size: Normal)   Pulse 71   Temp 98.8 F (37.1 C) (Oral)   Wt 180 lb 12.8 oz (82 kg)   SpO2 98%   BMI 34.16 kg/m   Physical Exam Vitals signs reviewed.  Constitutional:      Appearance: Normal appearance.  HENT:     Right Ear: Tympanic membrane and ear canal normal.     Left Ear: Tympanic membrane and ear canal normal.     Mouth/Throat:     Mouth: Mucous membranes are moist.     Pharynx: Oropharynx is clear.  Neck:     Musculoskeletal: Normal range of motion and neck supple.  Cardiovascular:     Rate and Rhythm: Normal rate and regular rhythm.  Pulmonary:     Effort: Pulmonary effort is normal.     Breath sounds: Normal breath sounds.  Abdominal:     General: Abdomen is flat. Bowel sounds are normal.     Palpations: Abdomen is soft.  Skin:    General: Skin is warm and dry.  Neurological:     General: No focal deficit present.     Mental Status: She is alert and oriented to person, place, and time.  Psychiatric:        Mood and Affect: Mood normal.        Behavior: Behavior normal.     Activities of Daily Living In your present state of health, do you have any difficulty performing the following activities: 01/13/2018 12/24/2017  Hearing? N N  Vision? N N  Difficulty concentrating or making  decisions? N N  Walking or climbing stairs? N N  Dressing or bathing? N N  Doing errands, shopping? N N  Preparing Food and eating ? N -  Using the Toilet? N -  In the past six months, have you accidently leaked urine? Y -  Comment Occasionally, wears pads for protection.  -  Do you have problems with loss of bowel control? N -  Managing your Medications? N -  Managing your Finances? N -  Housekeeping or managing  your Housekeeping? N -    Fall Risk Assessment Fall Risk  01/13/2018 12/24/2017 12/24/2017  Falls in the past year? 0 No No     Depression Screen PHQ 2/9 Scores 01/13/2018 12/24/2017 12/24/2017  PHQ - 2 Score 0 0 0    No flowsheet data found.   Assessment & Plan:     Annual Wellness Visit  Reviewed patient's Family Medical History Reviewed and updated list of patient's medical providers Assessment of cognitive impairment was done Assessed patient's functional ability Established a written schedule for health screening [place] Completed and Reviewed  Exercise Activities and Dietary recommendations Goals    . Exercise 3x per week (30 min per time)     Recommend to start walking 3 days a week for at least 30 minutes at a time.        Immunization History  Administered Date(s) Administered  . Influenza, High Dose Seasonal PF 12/14/2012, 11/24/2014, 12/11/2015, 12/11/2016, 12/24/2017  . Influenza, Seasonal, Injecte, Preservative Fre 12/09/2013  . Pneumococcal Conjugate-13 10/18/2014  . Pneumococcal Polysaccharide-23 03/12/1999  . Tdap 12/26/2016  . Zoster 03/11/2005    Health Maintenance  Topic Date Due  . DEXA SCAN  11/05/2020  . TETANUS/TDAP  12/27/2026  . INFLUENZA VACCINE  Completed  . PNA vac Low Risk Adult  Completed     Discussed health benefits of physical activity, and encouraged her to engage in regular exercise appropriate for her age and condition.    1. Annual physical exam  She will look into insurance  payment of shingrix.  2. Macrocytosis  - CBC with Differential - B12 - Folate  3. Hypertension  Continue current medication, HTN well controlled.  4. Hypothyroidism  Continue 112 mcg synthroid daily.  5. CKD stage 3  Avoid NSAIDs, keep HTN controlled.  Return in about 6 months (around 09/01/2018) for HTN.  The entirety of the information documented in the History of Present Illness, Review of Systems and Physical Exam were personally obtained by me. Portions of this information were initially documented by Lyndel Pleasure, CMA and reviewed by me for thoroughness and accuracy.        ------------------------------------------------------------------------------------------------------------    Trinna Post, PA-C  [facility]

## 2018-03-02 NOTE — Patient Instructions (Signed)
Vitamin B12 Deficiency Vitamin B12 deficiency means that your body is not getting enough vitamin B12. Your body needs vitamin B12 for important bodily functions. If you do not have enough vitamin B12 in your body, you can have health problems. Follow these instructions at home:  Take supplements only as told by your doctor. Follow the directions carefully.  Get any shots (injections) as told by your doctor. Do not miss your visits to the doctor.  Eat lots of healthy foods that contain vitamin B12. Ask your doctor if you should work with someone who is trained in how food affects health (dietitian). Foods that contain vitamin B12 include: ? Meat. ? Meat from birds (poultry). ? Fish. ? Eggs. ? Cereal and dairy products that are fortified. This means that vitamin B12 has been added to the food. Check the label on the package to see if the food is fortified.  Do not drink too much (do not abuse) alcohol.  Keep all follow-up visits as told by your doctor. This is important. Contact a doctor if:  Your symptoms come back. Get help right away if:  You have trouble breathing.  You have chest pain.  You get dizzy.  You pass out (lose consciousness). This information is not intended to replace advice given to you by your health care provider. Make sure you discuss any questions you have with your health care provider. Document Released: 02/14/2011 Document Revised: 08/03/2015 Document Reviewed: 07/13/2014 Elsevier Interactive Patient Education  2019 [employer]  

## 2018-03-03 LAB — CBC WITH DIFFERENTIAL/PLATELET
Basophils Absolute: 0 10*3/uL (ref 0.0–0.2)
Basos: 0 %
EOS (ABSOLUTE): 0.3 10*3/uL (ref 0.0–0.4)
Eos: 5 %
Hematocrit: 36.8 % (ref 34.0–46.6)
Hemoglobin: 12.3 g/dL (ref 11.1–15.9)
Immature Grans (Abs): 0.1 10*3/uL (ref 0.0–0.1)
Immature Granulocytes: 1 %
Lymphocytes Absolute: 1.8 10*3/uL (ref 0.7–3.1)
Lymphs: 26 %
MCH: 34.5 pg — ABNORMAL HIGH (ref 26.6–33.0)
MCHC: 33.4 g/dL (ref 31.5–35.7)
MCV: 103 fL — ABNORMAL HIGH (ref 79–97)
Monocytes Absolute: 1 10*3/uL — ABNORMAL HIGH (ref 0.1–0.9)
Monocytes: 14 %
Neutrophils Absolute: 3.7 10*3/uL (ref 1.4–7.0)
Neutrophils: 54 %
Platelets: 195 10*3/uL (ref 150–450)
RBC: 3.57 x10E6/uL — ABNORMAL LOW (ref 3.77–5.28)
RDW: 13.7 % (ref 12.3–15.4)
WBC: 7 10*3/uL (ref 3.4–10.8)

## 2018-03-03 LAB — FOLATE: Folate: >20 ng/mL (ref >3.0)

## 2018-03-03 LAB — VITAMIN B12: Vitamin B-12: 988 pg/mL (ref 232–1245)

## 2018-03-09 ENCOUNTER — Encounter: Payer: Self-pay | Admitting: Physician Assistant

## 2018-03-09 DIAGNOSIS — N183 Chronic kidney disease, stage 3 unspecified: Secondary | ICD-10-CM

## 2018-03-09 DIAGNOSIS — E039 Hypothyroidism, unspecified: Secondary | ICD-10-CM | POA: Insufficient documentation

## 2018-03-09 DIAGNOSIS — I10 Essential (primary) hypertension: Secondary | ICD-10-CM | POA: Insufficient documentation

## 2018-03-09 HISTORY — DX: Chronic kidney disease, stage 3 unspecified: N18.30

## 2018-03-24 DIAGNOSIS — H353112 Nonexudative age-related macular degeneration, right eye, intermediate dry stage: Secondary | ICD-10-CM | POA: Diagnosis not present

## 2018-03-24 DIAGNOSIS — H353221 Exudative age-related macular degeneration, left eye, with active choroidal neovascularization: Secondary | ICD-10-CM | POA: Diagnosis not present

## 2018-04-15 ENCOUNTER — Other Ambulatory Visit: Payer: Self-pay | Admitting: Physician Assistant

## 2018-04-15 DIAGNOSIS — I10 Essential (primary) hypertension: Secondary | ICD-10-CM

## 2018-04-15 NOTE — Telephone Encounter (Signed)
Patient is requesting a 90 day supply refill on the following medications  lisinopril-hydrochlorothiazide (ZESTORETIC) 20-12.5 MG tablet  levothyroxine (SYNTHROID, LEVOTHROID) 112 MCG tablet   She would like them sent to [facility] mail order pharmacy.

## 2018-04-16 ENCOUNTER — Telehealth: Payer: Self-pay | Admitting: Physician Assistant

## 2018-04-16 MED ORDER — LISINOPRIL-HYDROCHLOROTHIAZIDE 20-12.5 MG PO TABS: 1.0000 | ORAL_TABLET | Freq: Every day | ORAL | 0 refills | Status: DC

## 2018-04-16 MED ORDER — LEVOTHYROXINE SODIUM 112 MCG PO TABS: 112.0000 ug | ORAL_TABLET | Freq: Every day | ORAL | 0 refills | Status: DC

## 2018-04-16 NOTE — Telephone Encounter (Signed)
Patient was advised.  

## 2018-04-16 NOTE — Telephone Encounter (Signed)
Yes it is ok

## 2018-04-16 NOTE — Telephone Encounter (Signed)
pt called wanting to know if it is ok for her to take the generic rx for her synthroid. She said the insurance wants to use the generic  CB#  [phone]  Thanks Con Memos

## 2018-04-27 DIAGNOSIS — D2272 Melanocytic nevi of left lower limb, including hip: Secondary | ICD-10-CM | POA: Diagnosis not present

## 2018-04-27 DIAGNOSIS — Z08 Encounter for follow-up examination after completed treatment for malignant neoplasm: Secondary | ICD-10-CM | POA: Diagnosis not present

## 2018-04-27 DIAGNOSIS — Z85828 Personal history of other malignant neoplasm of skin: Secondary | ICD-10-CM | POA: Diagnosis not present

## 2018-04-27 DIAGNOSIS — X32XXXA Exposure to sunlight, initial encounter: Secondary | ICD-10-CM | POA: Diagnosis not present

## 2018-04-27 DIAGNOSIS — D2271 Melanocytic nevi of right lower limb, including hip: Secondary | ICD-10-CM | POA: Diagnosis not present

## 2018-04-27 DIAGNOSIS — D2262 Melanocytic nevi of left upper limb, including shoulder: Secondary | ICD-10-CM | POA: Diagnosis not present

## 2018-04-27 DIAGNOSIS — D2261 Melanocytic nevi of right upper limb, including shoulder: Secondary | ICD-10-CM | POA: Diagnosis not present

## 2018-04-27 DIAGNOSIS — L570 Actinic keratosis: Secondary | ICD-10-CM | POA: Diagnosis not present

## 2018-04-27 DIAGNOSIS — D225 Melanocytic nevi of trunk: Secondary | ICD-10-CM | POA: Diagnosis not present

## 2018-04-27 DIAGNOSIS — L821 Other seborrheic keratosis: Secondary | ICD-10-CM | POA: Diagnosis not present

## 2018-04-28 DIAGNOSIS — H353221 Exudative age-related macular degeneration, left eye, with active choroidal neovascularization: Secondary | ICD-10-CM | POA: Diagnosis not present

## 2018-05-13 ENCOUNTER — Encounter: Payer: Self-pay | Admitting: Physician Assistant

## 2018-05-13 ENCOUNTER — Ambulatory Visit: Payer: Medicare HMO | Admitting: Physician Assistant

## 2018-05-13 VITALS — BP 111/73 | HR 73 | Temp 98.3°F | Resp 16 | Wt 173.2 lb

## 2018-05-13 DIAGNOSIS — R197 Diarrhea, unspecified: Secondary | ICD-10-CM

## 2018-05-13 NOTE — Progress Notes (Signed)
Patient: Brenda Horton Female    DOB: 08-01-1929   85 y.o.   MRN: [id number] Visit Date: 05/13/2018  Today's Provider: Trinna Post, PA-C   Chief Complaint  Patient presents with  . Diarrhea   Subjective:     Diarrhea   This is a new problem. The current episode started 1 to 4 weeks ago. The problem occurs 2 to 4 times per day. The problem has been unchanged. The stool consistency is described as watery. The patient states that diarrhea does not awaken her from sleep. Pertinent negatives include no abdominal pain, bloating, chills, fever, headaches or vomiting. The symptoms are aggravated by rye/wheat (she is wondering about wheat products). There are no known risk factors. Treatments tried: Imodium every day. The treatment provided no relief.   History of C diff 7-8 years ago that did resolve but ever since then has had chronic diarrhea. She had a colonoscopy 2 years prior which she reports was normal. New change has been eating kashi wheat cereal otherwise feels well. No antibiotics in the past 3 months, no hospitalizations.   Wt Readings from Last 3 Encounters:  05/13/18 173 lb 3.2 oz (78.6 kg)  03/02/18 180 lb 12.8 oz (82 kg)  01/13/18 180 lb (81.6 kg)    No Known Allergies   Current Outpatient Medications:  .  aspirin 81 MG tablet, Take 81 mg by mouth daily. , Disp: , Rfl:  .  Biotin 2500 MCG CAPS, Take 7,500 mcg by mouth daily., Disp: , Rfl:  .  Coenzyme Q10 10 MG capsule, Take 10 mg by mouth daily. , Disp: , Rfl:  .  levothyroxine (SYNTHROID, LEVOTHROID) 112 MCG tablet, Take 1 tablet (112 mcg total) by mouth daily before breakfast., Disp: 90 tablet, Rfl: 0 .  lisinopril-hydrochlorothiazide (ZESTORETIC) 20-12.5 MG tablet, Take 1 tablet by mouth daily., Disp: 90 tablet, Rfl: 0 .  Multiple Vitamin (MULTIVITAMIN) capsule, Take 1 capsule by mouth daily. , Disp: , Rfl:  .  Multiple Vitamins-Minerals (PRESERVISION AREDS 2) CAPS, Take 2 capsules by mouth daily. , Disp: ,  Rfl:  .  psyllium (METAMUCIL) 58.6 % packet, Take 1 packet by mouth daily. , Disp: , Rfl:   Review of Systems  Constitutional: Negative for chills and fever.  Gastrointestinal: Positive for diarrhea. Negative for abdominal pain, bloating and vomiting.  Neurological: Negative for headaches.    Social History   Tobacco Use  . Smoking status: Never Smoker  . Smokeless tobacco: Never Used  Substance Use Topics  . Alcohol use: Yes    Frequency: Never    Comment: maybe 2 glasses a month      Objective:   BP 111/73 (BP Location: Left Arm, Patient Position: Sitting, Cuff Size: Large)   Pulse 73   Temp 98.3 F (36.8 C) (Oral)   Resp 16   Wt 173 lb 3.2 oz (78.6 kg)   BMI 32.73 kg/m  Vitals:   05/13/18 1451  BP: 111/73  Pulse: 73  Resp: 16  Temp: 98.3 F (36.8 C)  TempSrc: Oral  Weight: 173 lb 3.2 oz (78.6 kg)     Physical Exam Constitutional:      Appearance: Normal appearance. She is normal weight.  Cardiovascular:     Rate and Rhythm: Normal rate and regular rhythm.     Heart sounds: Normal heart sounds.  Pulmonary:     Effort: Pulmonary effort is normal.     Breath sounds: Normal breath sounds.  Abdominal:  General: Abdomen is flat. Bowel sounds are normal. There is no distension.     Palpations: Abdomen is soft.     Tenderness: There is no abdominal tenderness.  Skin:    General: Skin is warm and dry.  Neurological:     Mental Status: She is oriented to person, place, and time. Mental status is at baseline.  Psychiatric:        Mood and Affect: Mood normal.        Behavior: Behavior normal.         Assessment & Plan    1. Diarrhea, unspecified type  Patient has had chronic diarrhea since c diff 7 years ago. She has had normal colonoscopy 2 years ago. There are no current risk factors for c diff. Will check stool panel below, she will trial off the kashi cereal and we can consider anti-diarrheal medications. Follow up PRN.   - GI Profile, Stool,  PCR  The entirety of the information documented in the History of Present Illness, Review of Systems and Physical Exam were personally obtained by me. Portions of this information were initially documented by Lyndel Pleasure, CMA and reviewed by me for thoroughness and accuracy.         Trinna Post, PA-C  [facility]

## 2018-05-13 NOTE — Patient Instructions (Signed)

## 2018-05-17 LAB — GI PROFILE, STOOL, PCR

## 2018-05-20 ENCOUNTER — Telehealth: Payer: Self-pay | Admitting: Physician Assistant

## 2018-05-20 DIAGNOSIS — R197 Diarrhea, unspecified: Secondary | ICD-10-CM

## 2018-05-20 MED ORDER — AZITHROMYCIN 500 MG PO TABS: 500.0000 mg | ORAL_TABLET | Freq: Every day | ORAL | 0 refills | Status: DC

## 2018-05-20 NOTE — Telephone Encounter (Signed)
Pt called saying she is still having diarrhea.  Its been going for a week.  Please advise [phone]  Thanks teri

## 2018-05-20 NOTE — Telephone Encounter (Signed)
Discussion regarding this is documented on lab results. Does she want antibiotic treatment? Will send in azithromycin 500 mg daily x 3 days.

## 2018-05-20 NOTE — Telephone Encounter (Signed)
Please Advise

## 2018-06-25 ENCOUNTER — Ambulatory Visit: Payer: Self-pay | Admitting: Physician Assistant

## 2018-07-13 DIAGNOSIS — H353221 Exudative age-related macular degeneration, left eye, with active choroidal neovascularization: Secondary | ICD-10-CM | POA: Diagnosis not present

## 2018-08-06 ENCOUNTER — Ambulatory Visit: Payer: Self-pay | Admitting: Physician Assistant

## 2018-08-12 ENCOUNTER — Ambulatory Visit (INDEPENDENT_AMBULATORY_CARE_PROVIDER_SITE_OTHER): Payer: Medicare HMO | Admitting: Physician Assistant

## 2018-08-12 ENCOUNTER — Encounter: Payer: Medicare HMO | Admitting: Podiatry

## 2018-08-12 ENCOUNTER — Encounter: Payer: Self-pay | Admitting: Physician Assistant

## 2018-08-12 VITALS — BP 202/114 | HR 65

## 2018-08-12 DIAGNOSIS — N183 Chronic kidney disease, stage 3 unspecified: Secondary | ICD-10-CM

## 2018-08-12 DIAGNOSIS — R197 Diarrhea, unspecified: Secondary | ICD-10-CM

## 2018-08-12 DIAGNOSIS — E039 Hypothyroidism, unspecified: Secondary | ICD-10-CM

## 2018-08-12 DIAGNOSIS — I129 Hypertensive chronic kidney disease with stage 1 through stage 4 chronic kidney disease, or unspecified chronic kidney disease: Secondary | ICD-10-CM

## 2018-08-12 DIAGNOSIS — I10 Essential (primary) hypertension: Secondary | ICD-10-CM

## 2018-08-12 NOTE — Progress Notes (Signed)
Patient: Brenda Horton Female    DOB: 09-21-1929   85 y.o.   MRN: [id number] Visit Date: 08/12/2018  Today's Provider: Trinna Post, PA-C   Chief Complaint  Patient presents with  . Hypertension   Subjective:    Virtual Visit via Video Note  I connected with Damien Fusi on 08/21/18 at  9:40 AM EDT by a video enabled telemedicine application and verified that I am speaking with the correct person using two identifiers.   I discussed the limitations of evaluation and management by telemedicine and the availability of in person appointments. The patient expressed understanding and agreed to proceed.  Patient location: home Provider location: [place] office  Persons involved in the visit: patient, provider    HPI  Hypertension, follow-up:  BP Readings from Last 3 Encounters:  08/12/18 (!) 202/114  05/13/18 111/73  03/02/18 135/75    She was last seen for hypertension 6 months ago.  BP at that visit was 135/75. BP has come back down to 160/85. Reports the cuff was more around her wrist and she has been checking it and it has come down over the past few minutes.  Management changes since that visit include no changes. She reports excellent compliance with treatment. She is not having side effects.  She is exercising. She is adherent to low salt diet.   Outside blood pressures are stable. She is experiencing none.  Patient denies chest pain.   Cardiovascular risk factors include advanced age (older than 40 for men, 30 for women) and hypertension.  Use of agents associated with hypertension: none.     Weight trend: stable Wt Readings from Last 3 Encounters:  05/13/18 173 lb 3.2 oz (78.6 kg)  03/02/18 180 lb 12.8 oz (82 kg)  01/13/18 180 lb (81.6 kg)    Current diet: not asked  Hypothyroidism: Currently taking 112 mcg synthroid daily without issue.   Lab Results  Component Value Date   TSH 0.909 12/24/2017     ------------------------------------------------------------------------   No Known Allergies   Current Outpatient Medications:  .  aspirin 81 MG tablet, Take 81 mg by mouth daily. , Disp: , Rfl:  .  Biotin 2500 MCG CAPS, Take 7,500 mcg by mouth daily., Disp: , Rfl:  .  Coenzyme Q10 10 MG capsule, Take 10 mg by mouth daily. , Disp: , Rfl:  .  levothyroxine (SYNTHROID, LEVOTHROID) 112 MCG tablet, Take 1 tablet (112 mcg total) by mouth daily before breakfast., Disp: 90 tablet, Rfl: 0 .  lisinopril-hydrochlorothiazide (ZESTORETIC) 20-12.5 MG tablet, Take 1 tablet by mouth daily., Disp: 90 tablet, Rfl: 0 .  Multiple Vitamin (MULTIVITAMIN) capsule, Take 1 capsule by mouth daily. , Disp: , Rfl:  .  Multiple Vitamins-Minerals (PRESERVISION AREDS 2) CAPS, Take 2 capsules by mouth daily. , Disp: , Rfl:  .  psyllium (METAMUCIL) 58.6 % packet, Take 1 packet by mouth daily. , Disp: , Rfl:   Review of Systems  Constitutional: Negative.   Respiratory: Negative.   Cardiovascular: Negative.     Social History   Tobacco Use  . Smoking status: Never Smoker  . Smokeless tobacco: Never Used  Substance Use Topics  . Alcohol use: Yes    Frequency: Never    Comment: maybe 2 glasses a month      Objective:   BP (!) 202/114 (BP Location: Left Arm, Patient Position: Sitting, Cuff Size: Normal)   Pulse 65  Vitals:   08/12/18 0927  BP: Marland Kitchen)  202/114  Pulse: 65     Physical Exam Constitutional:      Appearance: Normal appearance.  Neurological:     Mental Status: She is alert.  Psychiatric:        Mood and Affect: Mood normal.        Behavior: Behavior normal.         Assessment & Plan    1. Hypertension, unspecified type  I think this is an inaccurate reading as her blood pressure is typically very well controlled. She can take her BP throughout the next week and call me with the readings. Continue lisinopril-HCTZ 20-12.5 mg daily.   - Comprehensive Metabolic Panel (CMET)  2.  Hypothyroidism, unspecified type  Continue levothyroxine 112 mcg daily.   - TSH  3. CKD (chronic kidney disease) stage 3, GFR 30-59 ml/min (HCC)   4. Diarrhea, unspecified type  The entirety of the information documented in the History of Present Illness, Review of Systems and Physical Exam were personally obtained by me. Portions of this information were initially documented by Lynford Humphrey, CME and reviewed by me for thoroughness and accuracy.   F/u 6 months for HTN         Trinna Post, PA-C  [place]

## 2018-08-21 ENCOUNTER — Telehealth: Payer: Self-pay | Admitting: Physician Assistant

## 2018-08-21 DIAGNOSIS — I10 Essential (primary) hypertension: Secondary | ICD-10-CM

## 2018-08-21 MED ORDER — LISINOPRIL-HYDROCHLOROTHIAZIDE 20-12.5 MG PO TABS: 1.0000 | ORAL_TABLET | Freq: Every day | ORAL | 0 refills | Status: DC

## 2018-08-21 NOTE — Patient Instructions (Signed)

## 2018-08-21 NOTE — Telephone Encounter (Signed)
Please let patient know I reviewed her blood pressures and they look great. We'll keep her medications the same.

## 2018-08-21 NOTE — Telephone Encounter (Signed)
Patient was advised and needed a refill on medication. Medication was send in to pharmacy.

## 2018-08-24 ENCOUNTER — Encounter: Payer: Self-pay | Admitting: Podiatry

## 2018-08-24 ENCOUNTER — Other Ambulatory Visit: Payer: Self-pay

## 2018-08-24 ENCOUNTER — Ambulatory Visit: Payer: Medicare HMO | Admitting: Podiatry

## 2018-08-24 DIAGNOSIS — M722 Plantar fascial fibromatosis: Secondary | ICD-10-CM | POA: Diagnosis not present

## 2018-08-24 DIAGNOSIS — M79675 Pain in left toe(s): Secondary | ICD-10-CM

## 2018-08-24 DIAGNOSIS — B351 Tinea unguium: Secondary | ICD-10-CM | POA: Diagnosis not present

## 2018-08-24 DIAGNOSIS — K635 Polyp of colon: Secondary | ICD-10-CM | POA: Insufficient documentation

## 2018-08-24 DIAGNOSIS — M79674 Pain in right toe(s): Secondary | ICD-10-CM | POA: Diagnosis not present

## 2018-08-24 NOTE — Progress Notes (Signed)
This patient presents the office with chief complaint of a painful second toenail left foot.  She says she experiences pain walking and wearing her shoes due to the thickness of the nail.  Patient has no history of trauma or injury to the toenail.  Patient does have a history of an ankle fracture in the 1980s.  She says that has limited her motion and causes pain and discomfort when she walks downhill.  She says that was extremely painful but it has improved over the last few days.  She presents the office today for an evaluation and treatment of her left foot.  Vascular  Dorsalis pedis and posterior tibial pulses are palpable  B/L.  Capillary return  WNL.  Temperature gradient is  WNL.  Skin turgor  WNL  Sensorium  Senn Weinstein monofilament wire  WNL. Normal tactile sensation.  Nail Exam  Patient has normal nails with no evidence of bacterial or fungal infection.  Orthopedic  Exam  Muscle tone and muscle strength  WNL.  No limitations of motion feet  B/L.  No crepitus or joint effusion noted.  Foot type is unremarkable and digits show no abnormalities.  Left ankle arthritis with exuberant bone.  HAV  B/L. Overlapping second toe right foot.  Skin  No open lesions.  Normal skin texture and turgor.  Onychomycosis second left.  Plantar fasciitis.  IE.  Debride nail second left.  Plantar fasciitis left.  IE.  Discussed plantar fascial pain left foot.  Debridement of second toenail left foot.   RTC 3 months.   Gardiner Barefoot DPM

## 2018-08-27 ENCOUNTER — Other Ambulatory Visit: Payer: Self-pay

## 2018-08-27 NOTE — Telephone Encounter (Signed)
Patient requesting refill on the following medication: levothyroxine (SYNTHROID, LEVOTHROID) 112 MCG tablet   Pharmacy: [facility]

## 2018-08-28 MED ORDER — LEVOTHYROXINE SODIUM 112 MCG PO TABS: 112.0000 ug | ORAL_TABLET | Freq: Every day | ORAL | 1 refills | Status: DC

## 2018-09-07 NOTE — Progress Notes (Signed)
This encounter was created in error - please disregard.

## 2018-09-14 DIAGNOSIS — H353221 Exudative age-related macular degeneration, left eye, with active choroidal neovascularization: Secondary | ICD-10-CM | POA: Diagnosis not present

## 2018-10-27 DIAGNOSIS — D2261 Melanocytic nevi of right upper limb, including shoulder: Secondary | ICD-10-CM | POA: Diagnosis not present

## 2018-10-27 DIAGNOSIS — D0461 Carcinoma in situ of skin of right upper limb, including shoulder: Secondary | ICD-10-CM | POA: Diagnosis not present

## 2018-10-27 DIAGNOSIS — D2272 Melanocytic nevi of left lower limb, including hip: Secondary | ICD-10-CM | POA: Diagnosis not present

## 2018-10-27 DIAGNOSIS — D225 Melanocytic nevi of trunk: Secondary | ICD-10-CM | POA: Diagnosis not present

## 2018-10-27 DIAGNOSIS — Z85828 Personal history of other malignant neoplasm of skin: Secondary | ICD-10-CM | POA: Diagnosis not present

## 2018-10-27 DIAGNOSIS — D485 Neoplasm of uncertain behavior of skin: Secondary | ICD-10-CM | POA: Diagnosis not present

## 2018-10-27 DIAGNOSIS — D2271 Melanocytic nevi of right lower limb, including hip: Secondary | ICD-10-CM | POA: Diagnosis not present

## 2018-10-27 DIAGNOSIS — L821 Other seborrheic keratosis: Secondary | ICD-10-CM | POA: Diagnosis not present

## 2018-10-27 DIAGNOSIS — D2262 Melanocytic nevi of left upper limb, including shoulder: Secondary | ICD-10-CM | POA: Diagnosis not present

## 2018-10-27 DIAGNOSIS — Z08 Encounter for follow-up examination after completed treatment for malignant neoplasm: Secondary | ICD-10-CM | POA: Diagnosis not present

## 2018-11-02 DIAGNOSIS — D0461 Carcinoma in situ of skin of right upper limb, including shoulder: Secondary | ICD-10-CM | POA: Diagnosis not present

## 2018-11-02 DIAGNOSIS — D485 Neoplasm of uncertain behavior of skin: Secondary | ICD-10-CM | POA: Diagnosis not present

## 2018-11-02 DIAGNOSIS — D0471 Carcinoma in situ of skin of right lower limb, including hip: Secondary | ICD-10-CM | POA: Diagnosis not present

## 2018-11-09 DIAGNOSIS — D0471 Carcinoma in situ of skin of right lower limb, including hip: Secondary | ICD-10-CM | POA: Diagnosis not present

## 2018-11-17 DIAGNOSIS — R69 Illness, unspecified: Secondary | ICD-10-CM | POA: Diagnosis not present

## 2018-11-17 DIAGNOSIS — H353221 Exudative age-related macular degeneration, left eye, with active choroidal neovascularization: Secondary | ICD-10-CM | POA: Diagnosis not present

## 2018-11-17 DIAGNOSIS — H353112 Nonexudative age-related macular degeneration, right eye, intermediate dry stage: Secondary | ICD-10-CM | POA: Diagnosis not present

## 2018-11-18 ENCOUNTER — Other Ambulatory Visit: Payer: Self-pay | Admitting: Physician Assistant

## 2018-11-18 DIAGNOSIS — I10 Essential (primary) hypertension: Secondary | ICD-10-CM

## 2018-11-19 ENCOUNTER — Telehealth: Payer: Self-pay | Admitting: Physician Assistant

## 2018-11-19 NOTE — Telephone Encounter (Signed)
Noted, thanks. BP looks great.

## 2018-11-19 NOTE — Telephone Encounter (Signed)
Pt called to give her BP 134/65.  Pt states her eye doctor is taking her off the fish oil.  [place]

## 2018-11-23 ENCOUNTER — Encounter: Payer: Self-pay | Admitting: Podiatry

## 2018-11-23 ENCOUNTER — Ambulatory Visit: Payer: Medicare HMO | Admitting: Podiatry

## 2018-11-23 ENCOUNTER — Other Ambulatory Visit: Payer: Self-pay

## 2018-11-23 DIAGNOSIS — M79674 Pain in right toe(s): Secondary | ICD-10-CM

## 2018-11-23 DIAGNOSIS — B351 Tinea unguium: Secondary | ICD-10-CM

## 2018-11-23 DIAGNOSIS — M2011 Hallux valgus (acquired), right foot: Secondary | ICD-10-CM

## 2018-11-23 DIAGNOSIS — M79675 Pain in left toe(s): Secondary | ICD-10-CM

## 2018-11-23 NOTE — Progress Notes (Signed)
This patient presents to the office for continued nail care, especially her second toenail left foot.  She says her fungal medicine is making an improvement.  She says her bunion and overlapping toe right foot is painful and she is unable to keep her padding on her toes before she loses her toes.  She presents for continued evaluation and treatment.  General Appearance  Alert, conversant and in no acute stress.  Vascular  Dorsalis pedis and posterior tibial  pulses are palpable  bilaterally.  Capillary return is within normal limits  bilaterally. Temperature is within normal limits  bilaterally.  Neurologic  Senn-Weinstein monofilament wire test within normal limits  bilaterally. Muscle power within normal limits bilaterally.  Nails Thick disfigured discolored nails with subungual debris  from hallux and second toe . No evidence of bacterial infection or drainage bilaterally.  Orthopedic  No limitations of motion  feet .  No crepitus or effusions noted.  No bony pathology or digital deformities noted.  HAV with overlapping second toe right foot.  HAV  Left foot.  Left ankle arthritis.  Skin  normotropic skin with no porokeratosis noted bilaterally.  No signs of infections or ulcers noted.     Onychomycosis  X 4.  HAV B/l  Debride nails.  Padding dispensed for separation 1/2 right foot.  RTC 3 months for nail care.   Gardiner Barefoot DPM

## 2019-01-10 DIAGNOSIS — Z7982 Long term (current) use of aspirin: Secondary | ICD-10-CM | POA: Diagnosis not present

## 2019-01-10 DIAGNOSIS — E039 Hypothyroidism, unspecified: Secondary | ICD-10-CM | POA: Diagnosis not present

## 2019-01-10 DIAGNOSIS — R32 Unspecified urinary incontinence: Secondary | ICD-10-CM | POA: Diagnosis not present

## 2019-01-10 DIAGNOSIS — Z85828 Personal history of other malignant neoplasm of skin: Secondary | ICD-10-CM | POA: Diagnosis not present

## 2019-01-10 DIAGNOSIS — E669 Obesity, unspecified: Secondary | ICD-10-CM | POA: Diagnosis not present

## 2019-01-10 DIAGNOSIS — I10 Essential (primary) hypertension: Secondary | ICD-10-CM | POA: Diagnosis not present

## 2019-01-10 DIAGNOSIS — H04129 Dry eye syndrome of unspecified lacrimal gland: Secondary | ICD-10-CM | POA: Diagnosis not present

## 2019-01-19 ENCOUNTER — Ambulatory Visit: Payer: Medicare HMO

## 2019-01-19 DIAGNOSIS — H353221 Exudative age-related macular degeneration, left eye, with active choroidal neovascularization: Secondary | ICD-10-CM | POA: Diagnosis not present

## 2019-01-19 NOTE — Progress Notes (Signed)
Subjective:   Brenda Horton is a 85 y.o. female who presents for Medicare Annual (Subsequent) preventive examination.    This visit is being conducted through telemedicine due to the COVID-19 pandemic. This patient has given me verbal consent via doximity to conduct this visit, patient states they are participating from their home address. Some vital signs may be absent or patient reported.    Patient identification: identified by name, DOB, and current address  Review of Systems:  N/A  Cardiac Risk Factors include: advanced age (>22men, >75 women);hypertension     Objective:     Vitals: There were no vitals taken for this visit.  There is no height or weight on file to calculate BMI. Unable to obtain vitals due to visit being conducted via telephonically.   Advanced Directives 01/20/2019 01/13/2018  Does Patient Have a Medical Advance Directive? Yes Yes  Type of Paramedic of [place];Living will [place];Living will  Copy of [place] in Chart? No - copy requested Yes    Tobacco Social History   Tobacco Use  Smoking Status Never Smoker  Smokeless Tobacco Never Used     Counseling given: Not Answered   Clinical Intake:  Pre-visit preparation completed: Yes  Pain : No/denies pain Pain Score: 0-No pain     Nutritional Risks: Nausea/ vomitting/ diarrhea(Diarrhea occasionally due to diet.) Diabetes: No  How often do you need to have someone help you when you read instructions, pamphlets, or other written materials from your doctor or pharmacy?: 1 - Never  Interpreter Needed?: No  Information entered by :: [facility], LPN  Past Medical History:  Diagnosis Date  . CKD (chronic kidney disease) stage 3, GFR 30-59 ml/min 03/09/2018  . Hypertension   . Macular degeneration   . Thyroid disease    Past Surgical History:  Procedure Laterality Date  . ABDOMINAL HYSTERECTOMY     Partial  . EYE SURGERY     . FRACTURE SURGERY     Family History  Problem Relation Age of Onset  . Hyperthyroidism Mother   . Hypertension Father   . Hyperthyroidism Daughter   . Arthritis Daughter   . Rheum arthritis Daughter   . Hyperthyroidism Son    Social History   Socioeconomic History  . Marital status: Widowed    Spouse name: Not on file  . Number of children: 5  . Years of education: Not on file  . Highest education level: Some college, no degree  Occupational History  . Occupation: retired  Scientific laboratory technician  . Financial resource strain: Not hard at all  . Food insecurity    Worry: Never true    Inability: Never true  . Transportation needs    Medical: No    Non-medical: No  Tobacco Use  . Smoking status: Never Smoker  . Smokeless tobacco: Never Used  Substance and Sexual Activity  . Alcohol use: Yes    Frequency: Never    Comment: maybe 2 glasses of wine a month  . Drug use: Never  . Sexual activity: Not on file  Lifestyle  . Physical activity    Days per week: 0 days    Minutes per session: 0 min  . Stress: Not at all  Relationships  . Social Herbalist on phone: Patient refused    Gets together: Patient refused    Attends religious service: Patient refused    Active member of club or organization: Patient refused  Attends meetings of clubs or organizations: Patient refused    Relationship status: Patient refused  Other Topics Concern  . Not on file  Social History Narrative  . Not on file    Outpatient Encounter Medications as of 01/20/2019  Medication Sig  . aspirin 81 MG tablet Take 81 mg by mouth daily.   . Coenzyme Q10 10 MG capsule Take 10 mg by mouth daily.   Marland Kitchen levothyroxine (SYNTHROID) 112 MCG tablet Take 1 tablet (112 mcg total) by mouth daily before breakfast.  . lisinopril-hydrochlorothiazide (ZESTORETIC) 20-12.5 MG tablet TAKE 1 TABLET DAILY  . Multiple Vitamin (MULTIVITAMIN) capsule Take 1 capsule by mouth daily.   . Multiple Vitamins-Minerals  (PRESERVISION AREDS 2) CAPS Take 2 capsules by mouth daily.   . Probiotic Product (ALIGN) 4 MG CAPS Take 4 mg by mouth daily.  . Biotin 2500 MCG CAPS Take 7,500 mcg by mouth daily.  . psyllium (METAMUCIL) 58.6 % packet Take 1 packet by mouth daily.    No facility-administered encounter medications on file as of 01/20/2019.     Activities of Daily Living In your present state of health, do you have any difficulty performing the following activities: 01/20/2019  Hearing? N  Vision? Y  Comment Due to MD.  Difficulty concentrating or making decisions? N  Walking or climbing stairs? N  Dressing or bathing? N  Doing errands, shopping? N  Preparing Food and eating ? N  Using the Toilet? N  In the past six months, have you accidently leaked urine? N  Do you have problems with loss of bowel control? N  Managing your Medications? N  Managing your Finances? N  Housekeeping or managing your Housekeeping? N  Some recent data might be hidden    Patient Care Team: Paulene Floor as PCP - General (Physician Assistant) Isaias Sakai, MD as Referring Physician (Ophthalmology) Gardiner Barefoot, DPM as Consulting Physician (Podiatry)    Assessment:   This is a routine wellness examination for [place].  Exercise Activities and Dietary recommendations Current Exercise Habits: Home exercise routine, Type of exercise: walking, Time (Minutes): 45, Frequency (Times/Week): 5, Weekly Exercise (Minutes/Week): 225, Intensity: Mild, Exercise limited by: None identified  Goals    . Exercise 3x per week (30 min per time)     Recommend to start walking 3 days a week for at least 30 minutes at a time.        Fall Risk: Fall Risk  01/20/2019 08/12/2018 01/13/2018 12/24/2017 12/24/2017  Falls in the past year? 0 0 0 No No  Number falls in past yr: 0 - - - -  Injury with Fall? 0 - - - -    FALL RISK PREVENTION PERTAINING TO THE HOME:  Any stairs in or around the home? Yes  If so, are there any  without handrails? No   Home free of loose throw rugs in walkways, pet beds, electrical cords, etc? Yes  Adequate lighting in your home to reduce risk of falls? Yes   ASSISTIVE DEVICES UTILIZED TO PREVENT FALLS:  Life alert? No  Use of a cane, walker or w/c? No  Grab bars in the bathroom? Yes  Shower chair or bench in shower? Yes  Elevated toilet seat or a handicapped toilet? Yes    TIMED UP AND GO:  Was the test performed? No .    Depression Screen PHQ 2/9 Scores 01/20/2019 01/13/2018 12/24/2017 12/24/2017  PHQ - 2 Score 0 0 0 0     Cognitive Function:  Declined today.         Immunization History  Administered Date(s) Administered  . Fluad Quad(high Dose 65+) 11/17/2018  . Influenza, High Dose Seasonal PF 12/14/2012, 11/24/2014, 12/11/2015, 12/11/2016, 12/24/2017  . Influenza, Seasonal, Injecte, Preservative Fre 12/09/2013  . Pneumococcal Conjugate-13 10/18/2014  . Pneumococcal Polysaccharide-23 03/12/1999  . Tdap 12/26/2016  . Zoster 03/11/2005  . Zoster Recombinat (Shingrix) 04/15/2018, 11/17/2018    Qualifies for Shingles Vaccine? Completed series  Tdap: Up to date  Flu Vaccine: Up to date  Pneumococcal Vaccine: Completed series  Screening Tests Health Maintenance  Topic Date Due  . DEXA SCAN  11/05/2020  . TETANUS/TDAP  12/27/2026  . INFLUENZA VACCINE  Completed  . PNA vac Low Risk Adult  Completed    Cancer Screenings:  Colorectal Screening: No longer required.   Mammogram: No longer required.   Bone Density: Completed 11/06/15. Results reflect OSTEOPENIA. Repeat every 5 years.   Lung Cancer Screening: (Low Dose CT Chest recommended if Age 61-80 years, 30 pack-year currently smoking OR have quit w/in 15years.) does not qualify.   Additional Screening:  Vision Screening: Recommended annual ophthalmology exams for early detection of glaucoma and other disorders of the eye.  Dental Screening: Recommended annual dental exams for proper oral  hygiene  Community Resource Referral:  CRR required this visit?  No       Plan:  I have personally reviewed and addressed the Medicare Annual Wellness questionnaire and have noted the following in the patient's chart:  A. Medical and social history B. Use of alcohol, tobacco or illicit drugs  C. Current medications and supplements D. Functional ability and status E.  Nutritional status F.  Physical activity G. Advance directives H. List of other physicians I.  Hospitalizations, surgeries, and ER visits in previous 12 months J.  [place] such as hearing and vision if needed, cognitive and depression L. Referrals and appointments   In addition, I have reviewed and discussed with patient certain preventive protocols, quality metrics, and best practice recommendations. A written personalized care plan for preventive services as well as general preventive health recommendations were provided to patient. Nurse Health Advisor  Signed,    Tenessa Marsee [place], [place]  [phone] Nurse Health Advisor   Nurse Notes: OV scheduled for tomorrow AM. Pt is concerned about ongoing diarrhea.

## 2019-01-20 ENCOUNTER — Other Ambulatory Visit: Payer: Self-pay

## 2019-01-20 ENCOUNTER — Ambulatory Visit (INDEPENDENT_AMBULATORY_CARE_PROVIDER_SITE_OTHER): Payer: Medicare HMO

## 2019-01-20 DIAGNOSIS — Z0000 Encounter for general adult medical examination without abnormal findings: Secondary | ICD-10-CM

## 2019-01-20 NOTE — Patient Instructions (Signed)
Brenda Horton , Thank you for taking time to come for your Medicare Wellness Visit. I appreciate your ongoing commitment to your health goals. Please review the following plan we discussed and let me know if I can assist you in the future.   Screening recommendations/referrals: Colonoscopy: No longer required.  Mammogram: No longer required.  Bone Density: Up to date, due 10/2020 Recommended yearly ophthalmology/optometry visit for glaucoma screening and checkup Recommended yearly dental visit for hygiene and checkup  Vaccinations: Influenza vaccine: Up to date Pneumococcal vaccine: Completed series Tdap vaccine: Up to date, due 12/2026 Shingles vaccine: Completed series    Advanced directives: Please bring a copy of your POA (Power of Attorney) and/or Living Will to your next appointment.   Conditions/risks identified: Continue to exercise and eat a healthy diet to aid with weight loss.   Next appointment: 01/21/19 @ 9:40 AM for an acute visit for diarrhea.    Preventive Care 55 Years and Older, Female Preventive care refers to lifestyle choices and visits with your health care provider that can promote health and wellness. What does preventive care include?  A yearly physical exam. This is also called an annual well check.  Dental exams once or twice a year.  Routine eye exams. Ask your health care provider how often you should have your eyes checked.  Personal lifestyle choices, including:  Daily care of your teeth and gums.  Regular physical activity.  Eating a healthy diet.  Avoiding tobacco and drug use.  Limiting alcohol use.  Practicing safe sex.  Taking low-dose aspirin every day.  Taking vitamin and mineral supplements as recommended by your health care provider. What happens during an annual well check? The services and screenings done by your health care provider during your annual well check will depend on your age, overall health, lifestyle risk factors,  and family history of disease. Counseling  Your health care provider may ask you questions about your:  Alcohol use.  Tobacco use.  Drug use.  Emotional well-being.  Home and relationship well-being.  Sexual activity.  Eating habits.  History of falls.  Memory and ability to understand (cognition).  Work and work Statistician.  Reproductive health. Screening  You may have the following tests or measurements:  Height, weight, and BMI.  Blood pressure.  Lipid and cholesterol levels. These may be checked every 5 years, or more frequently if you are over 55 years old.  Skin check.  Lung cancer screening. You may have this screening every year starting at age 51 if you have a 30-pack-year history of smoking and currently smoke or have quit within the past 15 years.  Fecal occult blood test (FOBT) of the stool. You may have this test every year starting at age 28.  Flexible sigmoidoscopy or colonoscopy. You may have a sigmoidoscopy every 5 years or a colonoscopy every 10 years starting at age 73.  Hepatitis C blood test.  Hepatitis B blood test.  Sexually transmitted disease (STD) testing.  Diabetes screening. This is done by checking your blood sugar (glucose) after you have not eaten for a while (fasting). You may have this done every 1-3 years.  Bone density scan. This is done to screen for osteoporosis. You may have this done starting at age 85.  Mammogram. This may be done every 1-2 years. Talk to your health care provider about how often you should have regular mammograms. Talk with your health care provider about your test results, treatment options, and if necessary, the need  for more tests. Vaccines  Your health care provider may recommend certain vaccines, such as:  Influenza vaccine. This is recommended every year.  Tetanus, diphtheria, and acellular pertussis (Tdap, Td) vaccine. You may need a Td booster every 10 years.  Zoster vaccine. You may need  this after age 65.  Pneumococcal 13-valent conjugate (PCV13) vaccine. One dose is recommended after age 105.  Pneumococcal polysaccharide (PPSV23) vaccine. One dose is recommended after age 46. Talk to your health care provider about which screenings and vaccines you need and how often you need them. This information is not intended to replace advice given to you by your health care provider. Make sure you discuss any questions you have with your health care provider. Document Released: 03/24/2015 Document Revised: 11/15/2015 Document Reviewed: 12/27/2014 Elsevier Interactive Patient Education  2017 [employer] Prevention in the Home Falls can cause injuries. They can happen to people of all ages. There are many things you can do to make your home safe and to help prevent falls. What can I do on the outside of my home?  Regularly fix the edges of walkways and driveways and fix any cracks.  Remove anything that might make you trip as you walk through a door, such as a raised step or threshold.  Trim any bushes or trees on the path to your home.  Use bright outdoor lighting.  Clear any walking paths of anything that might make someone trip, such as rocks or tools.  Regularly check to see if handrails are loose or broken. Make sure that both sides of any steps have handrails.  Any raised decks and porches should have guardrails on the edges.  Have any leaves, snow, or ice cleared regularly.  Use sand or salt on walking paths during winter.  Clean up any spills in your garage right away. This includes oil or grease spills. What can I do in the bathroom?  Use night lights.  Install grab bars by the toilet and in the tub and shower. Do not use towel bars as grab bars.  Use non-skid mats or decals in the tub or shower.  If you need to sit down in the shower, use a plastic, non-slip stool.  Keep the floor dry. Clean up any water that spills on the floor as soon as it  happens.  Remove soap buildup in the tub or shower regularly.  Attach bath mats securely with double-sided non-slip rug tape.  Do not have throw rugs and other things on the floor that can make you trip. What can I do in the bedroom?  Use night lights.  Make sure that you have a light by your bed that is easy to reach.  Do not use any sheets or blankets that are too big for your bed. They should not hang down onto the floor.  Have a firm chair that has side arms. You can use this for support while you get dressed.  Do not have throw rugs and other things on the floor that can make you trip. What can I do in the kitchen?  Clean up any spills right away.  Avoid walking on wet floors.  Keep items that you use a lot in easy-to-reach places.  If you need to reach something above you, use a strong step stool that has a grab bar.  Keep electrical cords out of the way.  Do not use floor polish or wax that makes floors slippery. If you must use wax, use  non-skid floor wax.  Do not have throw rugs and other things on the floor that can make you trip. What can I do with my stairs?  Do not leave any items on the stairs.  Make sure that there are handrails on both sides of the stairs and use them. Fix handrails that are broken or loose. Make sure that handrails are as long as the stairways.  Check any carpeting to make sure that it is firmly attached to the stairs. Fix any carpet that is loose or worn.  Avoid having throw rugs at the top or bottom of the stairs. If you do have throw rugs, attach them to the floor with carpet tape.  Make sure that you have a light switch at the top of the stairs and the bottom of the stairs. If you do not have them, ask someone to add them for you. What else can I do to help prevent falls?  Wear shoes that:  Do not have high heels.  Have rubber bottoms.  Are comfortable and fit you well.  Are closed at the toe. Do not wear sandals.  If you  use a stepladder:  Make sure that it is fully opened. Do not climb a closed stepladder.  Make sure that both sides of the stepladder are locked into place.  Ask someone to hold it for you, if possible.  Clearly mark and make sure that you can see:  Any grab bars or handrails.  First and last steps.  Where the edge of each step is.  Use tools that help you move around (mobility aids) if they are needed. These include:  Canes.  Walkers.  Scooters.  Crutches.  Turn on the lights when you go into a dark area. Replace any light bulbs as soon as they burn out.  Set up your furniture so you have a clear path. Avoid moving your furniture around.  If any of your floors are uneven, fix them.  If there are any pets around you, be aware of where they are.  Review your medicines with your doctor. Some medicines can make you feel dizzy. This can increase your chance of falling. Ask your doctor what other things that you can do to help prevent falls. This information is not intended to replace advice given to you by your health care provider. Make sure you discuss any questions you have with your health care provider. Document Released: 12/22/2008 Document Revised: 08/03/2015 Document Reviewed: 04/01/2014 Elsevier Interactive Patient Education  2017 [employer].

## 2019-01-21 ENCOUNTER — Ambulatory Visit (INDEPENDENT_AMBULATORY_CARE_PROVIDER_SITE_OTHER): Payer: Medicare HMO | Admitting: Physician Assistant

## 2019-01-21 DIAGNOSIS — R197 Diarrhea, unspecified: Secondary | ICD-10-CM | POA: Diagnosis not present

## 2019-01-21 NOTE — Progress Notes (Signed)
Patient: Brenda Horton Female    DOB: 1929-08-01   85 y.o.   MRN: [id number] Visit Date: 01/21/2019  Today's Provider: Trinna Post, PA-C   Chief Complaint  Patient presents with  . Diarrhea   Subjective:    Virtual Visit via Telephone Note  I connected with Brenda Horton on 01/21/19 at  9:40 AM EST by telephone and verified that I am speaking with the correct person using two identifiers.  Location: Patient: Home Provider: Office   I discussed the limitations, risks, security and privacy concerns of performing an evaluation and management service by telephone and the availability of in person appointments. I also discussed with the patient that there may be a patient responsible charge related to this service. The patient expressed understanding and agreed to proceed.  Diarrhea  This is a recurrent problem. The current episode started more than 1 month ago.   Patient is an 85 y/o woman with a history of HTN, c. Diff presenting today for diarrhea ongoing several weeks. Reports she is having some diarrhea and has eliminated coffee and dairy which has rought about slight improvement. She reports she was having several loose stools daily which has decreased to one daily after stopping the coffee. She reports taking the imodium. She denies blood in her stool, denies fever and abdominal pain. She notices increased symptoms with dairy. She has been eating cereal with milk.    She had an episode of diarrhea approximately one year ago that she did try to modify with dietary changes. These did not bring about improvement and on 05/13/2018 stool sample was positive for enteropathogenic E. Coli which was treated with azithromycin 500 mg BID x 3 days with good resolution.    No Known Allergies   Current Outpatient Medications:  .  aspirin 81 MG tablet, Take 81 mg by mouth daily. , Disp: , Rfl:  .  Biotin 2500 MCG CAPS, Take 7,500 mcg by mouth daily., Disp: , Rfl:  .  Coenzyme Q10  10 MG capsule, Take 10 mg by mouth daily. , Disp: , Rfl:  .  levothyroxine (SYNTHROID) 112 MCG tablet, Take 1 tablet (112 mcg total) by mouth daily before breakfast., Disp: 90 tablet, Rfl: 1 .  lisinopril-hydrochlorothiazide (ZESTORETIC) 20-12.5 MG tablet, TAKE 1 TABLET DAILY, Disp: 90 tablet, Rfl: 0 .  Multiple Vitamin (MULTIVITAMIN) capsule, Take 1 capsule by mouth daily. , Disp: , Rfl:  .  Multiple Vitamins-Minerals (PRESERVISION AREDS 2) CAPS, Take 2 capsules by mouth daily. , Disp: , Rfl:  .  Probiotic Product (ALIGN) 4 MG CAPS, Take 4 mg by mouth daily., Disp: , Rfl:  .  psyllium (METAMUCIL) 58.6 % packet, Take 1 packet by mouth daily. , Disp: , Rfl:   Review of Systems  Constitutional: Negative.   Respiratory: Negative.   Gastrointestinal: Positive for diarrhea.  Genitourinary: Negative.     Social History   Tobacco Use  . Smoking status: Never Smoker  . Smokeless tobacco: Never Used  Substance Use Topics  . Alcohol use: Yes    Frequency: Never    Comment: maybe 2 glasses of wine a month      Objective:   There were no vitals taken for this visit. There were no vitals filed for this visit.There is no height or weight on file to calculate BMI.   Physical Exam   No results found for any visits on 01/21/19.     Assessment & Plan    1.  Diarrhea, unspecified type  Patient is comfortable observing for now. She is having improvement with eliminating coffee and dairy. She will call back to clinic if diarrhea is persisting and we will move forward with stool testing and possible referral to GI.   I discussed the assessment and treatment plan with the patient. The patient was provided an opportunity to ask questions and all were answered. The patient agreed with the plan and demonstrated an understanding of the instructions.   The patient was advised to call back or seek an in-person evaluation if the symptoms worsen or if the condition fails to improve as anticipated.  I  provided 15 minutes of non-face-to-face time during this encounter.  The entirety of the information documented in the History of Present Illness, Review of Systems and Physical Exam were personally obtained by me. Portions of this information were initially documented by [facility], CMA and reviewed by me for thoroughness and accuracy.         Trinna Post, PA-C  [facility]

## 2019-02-12 ENCOUNTER — Other Ambulatory Visit: Payer: Self-pay | Admitting: Physician Assistant

## 2019-02-12 DIAGNOSIS — I10 Essential (primary) hypertension: Secondary | ICD-10-CM

## 2019-02-22 ENCOUNTER — Ambulatory Visit: Payer: Medicare HMO | Admitting: Podiatry

## 2019-02-25 ENCOUNTER — Other Ambulatory Visit: Payer: Self-pay

## 2019-02-25 ENCOUNTER — Ambulatory Visit: Payer: Medicare HMO | Admitting: Podiatry

## 2019-02-25 ENCOUNTER — Encounter: Payer: Self-pay | Admitting: Podiatry

## 2019-02-25 DIAGNOSIS — M79675 Pain in left toe(s): Secondary | ICD-10-CM

## 2019-02-25 DIAGNOSIS — M79674 Pain in right toe(s): Secondary | ICD-10-CM | POA: Diagnosis not present

## 2019-02-25 DIAGNOSIS — B351 Tinea unguium: Secondary | ICD-10-CM

## 2019-02-25 DIAGNOSIS — M2011 Hallux valgus (acquired), right foot: Secondary | ICD-10-CM

## 2019-02-25 NOTE — Progress Notes (Signed)
This patient presents to the office for continued nail care, especially her second toenail left foot.  She says her fungal medicine is making an improvement.  She says her bunion and overlapping toe right foot is painful .  She presents for continued evaluation and treatment.  General Appearance  Alert, conversant and in no acute stress.  Vascular  Dorsalis pedis and posterior tibial  pulses are palpable  bilaterally.  Capillary return is within normal limits  bilaterally. Temperature is within normal limits  bilaterally.  Neurologic  Senn-Weinstein monofilament wire test within normal limits  bilaterally. Muscle power within normal limits bilaterally.  Nails Thick disfigured discolored nails with subungual debris  from hallux and second toe . No evidence of bacterial infection or drainage bilaterally.  Orthopedic  No limitations of motion  feet .  No crepitus or effusions noted.  No bony pathology or digital deformities noted.  HAV with overlapping second toe right foot.  HAV  Left foot.  Left ankle arthritis.  Skin  normotropic skin with no porokeratosis noted bilaterally.  No signs of infections or ulcers noted.     Onychomycosis  X 4.  HAV B/l  Debride nails.  Continue using medicine on second toe especially. RTC 3 months for nail care.   Gardiner Barefoot DPM

## 2019-03-23 DIAGNOSIS — H353221 Exudative age-related macular degeneration, left eye, with active choroidal neovascularization: Secondary | ICD-10-CM | POA: Diagnosis not present

## 2019-04-05 ENCOUNTER — Encounter: Payer: Self-pay | Admitting: Physician Assistant

## 2019-04-27 ENCOUNTER — Other Ambulatory Visit: Payer: Self-pay | Admitting: Physician Assistant

## 2019-04-27 DIAGNOSIS — I10 Essential (primary) hypertension: Secondary | ICD-10-CM

## 2019-05-06 ENCOUNTER — Ambulatory Visit: Payer: Medicare HMO | Admitting: Podiatry

## 2019-05-06 ENCOUNTER — Encounter: Payer: Self-pay | Admitting: Podiatry

## 2019-05-06 ENCOUNTER — Other Ambulatory Visit: Payer: Self-pay

## 2019-05-06 DIAGNOSIS — M79675 Pain in left toe(s): Secondary | ICD-10-CM

## 2019-05-06 DIAGNOSIS — M2011 Hallux valgus (acquired), right foot: Secondary | ICD-10-CM

## 2019-05-06 DIAGNOSIS — L608 Other nail disorders: Secondary | ICD-10-CM | POA: Diagnosis not present

## 2019-05-06 DIAGNOSIS — M79674 Pain in right toe(s): Secondary | ICD-10-CM

## 2019-05-06 DIAGNOSIS — B351 Tinea unguium: Secondary | ICD-10-CM

## 2019-05-06 NOTE — Progress Notes (Signed)
This patient presents to the office for continued nail care, especially her second toenail left foot.  She says her second toe right is also thickening..  She says her bunion and overlapping toe right foot is painful .  She presents for continued evaluation and treatment.  General Appearance  Alert, conversant and in no acute stress.  Vascular  Dorsalis pedis and posterior tibial  pulses are palpable  bilaterally.  Capillary return is within normal limits  bilaterally. Temperature is within normal limits  bilaterally.  Neurologic  Senn-Weinstein monofilament wire test within normal limits  bilaterally. Muscle power within normal limits bilaterally.  Nails Thick disfigured discolored nails with subungual debris  from hallux and second toe . No evidence of bacterial infection or drainage bilaterally.  Orthopedic  No limitations of motion  feet .  No crepitus or effusions noted.  No bony pathology or digital deformities noted.  HAV with overlapping second toe right foot.  HAV  Left foot.  Left ankle arthritis.  Skin  normotropic skin with no porokeratosis noted bilaterally.  No signs of infections or ulcers noted.     Onychomycosis  X 4.  HAV B/l  Debride nails.  Continue using medicine on second toe especially. RTC 3 months for nail care.   Gardiner Barefoot DPM

## 2019-08-03 ENCOUNTER — Encounter: Payer: Self-pay | Admitting: Physician Assistant

## 2019-08-03 ENCOUNTER — Ambulatory Visit: Payer: Self-pay | Admitting: *Deleted

## 2019-08-03 ENCOUNTER — Ambulatory Visit: Payer: Medicare HMO | Admitting: Physician Assistant

## 2019-08-03 ENCOUNTER — Other Ambulatory Visit: Payer: Self-pay

## 2019-08-03 VITALS — BP 138/78 | HR 71 | Temp 96.0°F | Wt 167.8 lb

## 2019-08-03 DIAGNOSIS — I10 Essential (primary) hypertension: Secondary | ICD-10-CM

## 2019-08-03 DIAGNOSIS — E039 Hypothyroidism, unspecified: Secondary | ICD-10-CM | POA: Diagnosis not present

## 2019-08-03 DIAGNOSIS — R42 Dizziness and giddiness: Secondary | ICD-10-CM | POA: Diagnosis not present

## 2019-08-03 NOTE — Progress Notes (Signed)
Established patient visit   Patient: Brenda Horton   DOB: 05/27/1929   86 y.o. Female  MRN: [id number] Visit Date: 08/03/2019  Today's healthcare provider: Trinna Post, PA-C   Chief Complaint  Patient presents with  . Dizziness  I,Tarique Loveall M Josimar Corning,acting as a scribe for Trinna Post, PA-C.,have documented all relevant documentation on the behalf of Trinna Post, PA-C,as directed by  Trinna Post, PA-C while in the presence of Trinna Post, PA-C.  Subjective    Dizziness This is a new problem. The current episode started in the past 7 days. The problem occurs constantly. The problem has been unchanged. Pertinent negatives include no chest pain, chills, fatigue, numbness, vertigo or visual change.   Reports going to bed last night and feeling "spongey." Patient denies dizziness in particular but feels more weak. She is not having difficulty doing her normal activities. She is walking every day two miles. No chest pain, fainting, SOB. Denies symptoms when she stood up. She is currently on BP medication and takes this without issue. Patient is unsure she drank enough fluids yesterday and she normally takes melatonin when she goes to bed. For the past two nights however she has taken L-theanine. She is feeling back to normal today mostly.   She has a history of hypothyroidism for which she takes synthroid 112 mcg daily. She also has a history of macrocytosis x 5 years without B12 or folate deficiency.   BP Readings from Last 3 Encounters:  08/03/19 138/78  08/12/18 (!) 202/114  05/13/18 111/73    Wt Readings from Last 3 Encounters:  08/03/19 167 lb 12.8 oz (76.1 kg)  05/13/18 173 lb 3.2 oz (78.6 kg)  03/02/18 180 lb 12.8 oz (82 kg)         Medications: Outpatient Medications Prior to Visit  Medication Sig  . aspirin 81 MG tablet Take 81 mg by mouth daily.   . Biotin 2500 MCG CAPS Take 7,500 mcg by mouth daily.  . Coenzyme Q10 10 MG capsule Take 10 mg  by mouth daily.   Marland Kitchen levothyroxine (SYNTHROID) 112 MCG tablet TAKE 1 TABLET DAILY BEFORE BREAKFAST  . lisinopril-hydrochlorothiazide (ZESTORETIC) 20-12.5 MG tablet TAKE 1 TABLET DAILY  . Multiple Vitamin (MULTIVITAMIN) capsule Take 1 capsule by mouth daily.   . Multiple Vitamins-Minerals (PRESERVISION AREDS 2) CAPS Take 2 capsules by mouth daily.   . Probiotic Product (ALIGN) 4 MG CAPS Take 4 mg by mouth daily.  . psyllium (METAMUCIL) 58.6 % packet Take 1 packet by mouth daily.   . RESTASIS 0.05 % ophthalmic emulsion   . SHINGRIX injection    No facility-administered medications prior to visit.    Review of Systems  Constitutional: Negative for chills and fatigue.  Cardiovascular: Negative for chest pain.  Neurological: Positive for dizziness. Negative for vertigo and numbness.    {Heme  Chem  Endocrine  Serology  Results Review (optional):[id number]::" "}  Objective    BP 138/78 (BP Location: Left Arm, Patient Position: Sitting, Cuff Size: Normal)   Pulse 71   Temp (!) 96 F (35.6 C) (Temporal)   Wt 167 lb 12.8 oz (76.1 kg)   SpO2 96%   BMI 31.71 kg/m    Physical Exam Constitutional:      Appearance: Normal appearance.  Cardiovascular:     Rate and Rhythm: Normal rate and regular rhythm.     Heart sounds: Normal heart sounds.  Pulmonary:     Effort: Pulmonary effort is  normal.     Breath sounds: Normal breath sounds.  Skin:    General: Skin is warm and dry.  Neurological:     Mental Status: She is alert and oriented to person, place, and time. Mental status is at baseline.  Psychiatric:        Mood and Affect: Mood normal.        Behavior: Behavior normal.       No results found for any visits on 08/03/19.  Assessment & Plan    1. Dizziness  Uncertain etiology, exam is benign today. EKG is normal. Will update labwork as below to search for any possible cause.   - EKG 12-Lead - Comprehensive Metabolic Panel (CMET) - CBC with Differential - TSH - Lipid  Profile  2. Hypertension, unspecified type  - Comprehensive Metabolic Panel (CMET) - CBC with Differential - TSH - Lipid Profile  3. Hypothyroidism, unspecified type    No follow-ups on file.      ITrinna Post, PA-C, have reviewed all documentation for this visit. The documentation on 08/03/19 for the exam, diagnosis, procedures, and orders are all accurate and complete.    Paulene Floor  [facility] [phone] (phone) [phone] (fax)  [place]

## 2019-08-03 NOTE — Patient Instructions (Signed)

## 2019-08-03 NOTE — Telephone Encounter (Signed)
Pt called with complaints of dizziness 0800 08/03/19; she feels woozy intermittently The pt states she changed her Melatonin 3 mg to L-theanine 100 mg; recommendations made per nurse triage protocol; decision tree completed; the pt would like a virtual visit; pt offered and accepted virtual visit with Carles Collet, [place] [facility] 08/03/19 at [place]; she verbalized understanding and would like to be contacted on her cell phone [phone]; will route to office for notification; Elmyra Ricks notified of upcoming visit. Reason for Disposition . [1] MILD dizziness (e.g., walking normally) AND [2] has NOT been evaluated by physician for this  (Exception: dizziness caused by heat exposure, sudden standing, or poor fluid intake)  Answer Assessment - Initial Assessment Questions 1. DESCRIPTION: "Describe your dizziness."     woozy 2. LIGHTHEADED: "Do you feel lightheaded?" (e.g., somewhat faint, woozy, weak upon standing)     woozy 3. VERTIGO: "Do you feel like either you or the room is spinning or tilting?" (i.e. vertigo)     no 4. SEVERITY: "How bad is it?"  "Do you feel like you are going to faint?" "Can you stand and walk?"   - MILD - walking normally   - MODERATE - interferes with normal activities (e.g., work, school)    - SEVERE - unable to stand, requires support to walk, feels like passing out now.     mild 5. ONSET:  "When did the dizziness begin?"     0800 08/03/19 6. AGGRAVATING FACTORS: "Does anything make it worse?" (e.g., standing, change in head position)     no 7. HEART RATE: "Can you tell me your heart rate?" "How many beats in 15 seconds?"  (Note: not all patients can do this)      Pt can not complete this task 8. CAUSE: "What do you think is causing the dizziness?"    Not sure 9. RECURRENT SYMPTOM: "Have you had dizziness before?" If so, ask: "When was the last time?" "What happened that time?"     no 10. OTHER SYMPTOMS: "Do you have any other symptoms?" (e.g., fever, chest pain,  vomiting, diarrhea, bleeding)       Felt "spongy" like she was not standing on the floor 08/02/19 at 2230 11. PREGNANCY: "Is there any chance you are pregnant?" "When was your last menstrual period?"      no  Protocols used: DIZZINESS [facility]

## 2019-08-04 LAB — COMPREHENSIVE METABOLIC PANEL
ALT: 16 IU/L (ref 0–32)
AST: 19 IU/L (ref 0–40)
Albumin/Globulin Ratio: 1.8 (ref 1.2–2.2)
Albumin: 4.3 g/dL (ref 3.6–4.6)
Alkaline Phosphatase: 30 IU/L — ABNORMAL LOW (ref 48–121)
BUN/Creatinine Ratio: 29 — ABNORMAL HIGH (ref 12–28)
BUN: 31 mg/dL — ABNORMAL HIGH (ref 8–27)
Bilirubin Total: 0.5 mg/dL (ref 0.0–1.2)
CO2: 22 mmol/L (ref 20–29)
Calcium: 9.6 mg/dL (ref 8.7–10.3)
Chloride: 103 mmol/L (ref 96–106)
Creatinine, Ser: 1.08 mg/dL — ABNORMAL HIGH (ref 0.57–1.00)
GFR calc Af Amer: 53 mL/min/{1.73_m2} — ABNORMAL LOW (ref >59)
GFR calc non Af Amer: 46 mL/min/{1.73_m2} — ABNORMAL LOW (ref >59)
Globulin, Total: 2.4 g/dL (ref 1.5–4.5)
Glucose: 93 mg/dL (ref 65–99)
Potassium: 4.3 mmol/L (ref 3.5–5.2)
Sodium: 141 mmol/L (ref 134–144)
Total Protein: 6.7 g/dL (ref 6.0–8.5)

## 2019-08-04 LAB — LIPID PANEL
Chol/HDL Ratio: 3 ratio (ref 0.0–4.4)
Cholesterol, Total: 183 mg/dL (ref 100–199)
HDL: 61 mg/dL (ref >39)
LDL Chol Calc (NIH): 104 mg/dL — ABNORMAL HIGH (ref 0–99)
Triglycerides: 103 mg/dL (ref 0–149)
VLDL Cholesterol Cal: 18 mg/dL (ref 5–40)

## 2019-08-04 LAB — CBC WITH DIFFERENTIAL/PLATELET
Basophils Absolute: 0.1 10*3/uL (ref 0.0–0.2)
Basos: 1 %
EOS (ABSOLUTE): 0.4 10*3/uL (ref 0.0–0.4)
Eos: 7 %
Hematocrit: 39.1 % (ref 34.0–46.6)
Hemoglobin: 13 g/dL (ref 11.1–15.9)
Immature Grans (Abs): 0.1 10*3/uL (ref 0.0–0.1)
Immature Granulocytes: 2 %
Lymphocytes Absolute: 1.7 10*3/uL (ref 0.7–3.1)
Lymphs: 33 %
MCH: 34.1 pg — ABNORMAL HIGH (ref 26.6–33.0)
MCHC: 33.2 g/dL (ref 31.5–35.7)
MCV: 103 fL — ABNORMAL HIGH (ref 79–97)
Monocytes Absolute: 0.7 10*3/uL (ref 0.1–0.9)
Monocytes: 14 %
Neutrophils Absolute: 2.3 10*3/uL (ref 1.4–7.0)
Neutrophils: 43 %
Platelets: 212 10*3/uL (ref 150–450)
RBC: 3.81 x10E6/uL (ref 3.77–5.28)
RDW: 13.4 % (ref 11.7–15.4)
WBC: 5.2 10*3/uL (ref 3.4–10.8)

## 2019-08-04 LAB — TSH: TSH: 0.866 u[IU]/mL (ref 0.450–4.500)

## 2019-08-05 ENCOUNTER — Encounter: Payer: Self-pay | Admitting: Podiatry

## 2019-08-05 ENCOUNTER — Other Ambulatory Visit: Payer: Self-pay

## 2019-08-05 ENCOUNTER — Ambulatory Visit: Payer: Medicare HMO | Admitting: Podiatry

## 2019-08-05 ENCOUNTER — Encounter: Payer: Self-pay | Admitting: Physician Assistant

## 2019-08-05 DIAGNOSIS — M2011 Hallux valgus (acquired), right foot: Secondary | ICD-10-CM

## 2019-08-05 DIAGNOSIS — N183 Chronic kidney disease, stage 3 unspecified: Secondary | ICD-10-CM | POA: Diagnosis not present

## 2019-08-05 DIAGNOSIS — L608 Other nail disorders: Secondary | ICD-10-CM

## 2019-08-05 DIAGNOSIS — M79674 Pain in right toe(s): Secondary | ICD-10-CM

## 2019-08-05 DIAGNOSIS — B351 Tinea unguium: Secondary | ICD-10-CM

## 2019-08-05 DIAGNOSIS — M79675 Pain in left toe(s): Secondary | ICD-10-CM

## 2019-08-05 NOTE — Progress Notes (Addendum)
This patient returns to my office for at risk foot care.  This patient requires this care by a professional since this patient will be at risk.    This patient is unable to cut nails herself since the patient cannot reach her nails.These nails are painful walking and wearing shoes.  This patient presents for at risk foot care today.  General Appearance  Alert, conversant and in no acute stress.  Vascular  Dorsalis pedis and posterior tibial  pulses are palpable  bilaterally.  Capillary return is within normal limits  bilaterally. Temperature is within normal limits  bilaterally.  Neurologic  Senn-Weinstein monofilament wire test within normal limits  bilaterally. Muscle power within normal limits bilaterally.  Nails Thick disfigured discolored nails with subungual debris  Hallux and second toe  B/l.Marland Kitchen No evidence of bacterial infection or drainage bilaterally.  Orthopedic  No limitations of motion  feet .  No crepitus or effusions noted.  HAV with overlapping second toe right foot.  HAV right foot.  Left ankle arthritis.  Skin  normotropic skin with no porokeratosis noted bilaterally.  No signs of infections or ulcers noted.     Onychomycosis  Pain in right toes  Pain in left toes  Consent was obtained for treatment procedures.   Mechanical debridement of nails 1-2  bilaterally performed with a nail nipper.  Filed with dremel without incident.    Return office visit  3 months                   Told patient to return for periodic foot care and evaluation due to potential at risk complications.   Gardiner Barefoot DPM   Patient called on 09/23/2019 saying she does not have stage 3 chronic kidney disease and asks me to remove it from her records.  Will do.  Gardiner Barefoot DPM

## 2019-08-06 ENCOUNTER — Encounter: Payer: Self-pay | Admitting: Physician Assistant

## 2019-08-10 ENCOUNTER — Other Ambulatory Visit: Payer: Self-pay | Admitting: Physician Assistant

## 2019-08-10 DIAGNOSIS — I10 Essential (primary) hypertension: Secondary | ICD-10-CM

## 2019-08-10 NOTE — Telephone Encounter (Signed)
Requested Prescriptions  Pending Prescriptions Disp Refills  . levothyroxine (SYNTHROID) 112 MCG tablet [Pharmacy Med Name: LEVOTHYROXIN TAB 0.112MG ] 90 tablet 3    Sig: TAKE 1 TABLET DAILY BEFORE BREAKFAST     Endocrinology:  Hypothyroid Agents Failed - 08/10/2019 10:47 AM      Failed - TSH needs to be rechecked within 3 months after an abnormal result. Refill until TSH is due.      Passed - TSH in normal range and within 360 days    TSH  Date Value Ref Range Status  08/03/2019 0.866 0.450 - 4.500 uIU/mL Final         Passed - Valid encounter within last 12 months    Recent Outpatient Visits          1 week ago [place], [place], PA-C   6 months ago Diarrhea, unspecified type   [place], [place], [place]   12 months ago Hypertension, unspecified type   [facility] [place], [place], PA-C   1 year ago Diarrhea, unspecified type   [facility] Carles Collet M, [place]   1 year ago Annual physical exam   [facility] Terrilee Croak, Adriana M, [place]             . lisinopril-hydrochlorothiazide (ZESTORETIC) 20-12.5 MG tablet [Pharmacy Med Name: LISINOP/HCTZ TAB 20-12.5] 90 tablet 0    Sig: TAKE 1 TABLET DAILY     Cardiovascular:  ACEI + Diuretic Combos Failed - 08/10/2019 10:47 AM      Failed - Cr in normal range and within 180 days    Creatinine, Ser  Date Value Ref Range Status  08/03/2019 1.08 (H) 0.57 - 1.00 mg/dL Final         Failed - Valid encounter within last 6 months    Recent Outpatient Visits          1 week ago [place], [place], PA-C   6 months ago Diarrhea, unspecified type   [place], [place], PA-C   12 months ago Hypertension, unspecified type   [facility] [place], [place], PA-C   1 year ago Diarrhea, unspecified type   [facility] [place], Fabio Bering M, [place]   1  year ago Annual physical exam   [facility] Terrilee Croak, Adriana M, PA-C             Passed - Na in normal range and within 180 days    Sodium  Date Value Ref Range Status  08/03/2019 141 134 - 144 mmol/L Final         Passed - K in normal range and within 180 days    Potassium  Date Value Ref Range Status  08/03/2019 4.3 3.5 - 5.2 mmol/L Final         Passed - Ca in normal range and within 180 days    Calcium  Date Value Ref Range Status  08/03/2019 9.6 8.7 - 10.3 mg/dL Final         Passed - Patient is not pregnant      Passed - Last BP in normal range    BP Readings from Last 1 Encounters:  08/03/19 138/78          Pt. Was seen in office 08/03/19; refills given per protocol.

## 2019-11-08 ENCOUNTER — Encounter: Payer: Self-pay | Admitting: Podiatry

## 2019-11-08 ENCOUNTER — Other Ambulatory Visit: Payer: Self-pay

## 2019-11-08 ENCOUNTER — Ambulatory Visit: Payer: Medicare HMO | Admitting: Podiatry

## 2019-11-08 DIAGNOSIS — M79674 Pain in right toe(s): Secondary | ICD-10-CM

## 2019-11-08 DIAGNOSIS — M79675 Pain in left toe(s): Secondary | ICD-10-CM

## 2019-11-08 DIAGNOSIS — B351 Tinea unguium: Secondary | ICD-10-CM

## 2019-11-08 DIAGNOSIS — L608 Other nail disorders: Secondary | ICD-10-CM

## 2019-11-08 NOTE — Progress Notes (Signed)
This patient returns to the office for evaluation and treatment of long thick painful nails .  This patient is unable to trim his own nails  on her big and second toenails  left foot.since the patient cannot reach his feet.  Patient says the nails are painful walking and wearing his shoes.  He returns for preventive foot care services. ° °General Appearance  Alert, conversant and in no acute stress. ° °Vascular  Dorsalis pedis and posterior tibial  pulses are palpable  bilaterally.  Capillary return is within normal limits  bilaterally. Temperature is within normal limits  bilaterally. ° °Neurologic  Senn-Weinstein monofilament wire test within normal limits  bilaterally. Muscle power within normal limits bilaterally. ° °Nails Thick disfigured discolored nails with subungual debris  First and second toenails left foot.. No evidence of bacterial infection or drainage bilaterally. ° °Orthopedic  No limitations of motion  feet .  No crepitus or effusions noted.  No bony pathology or digital deformities noted. ° °Skin  normotropic skin with no porokeratosis noted bilaterally.  No signs of infections or ulcers noted.    ° °Onychomycosis    Pain in toes left foot ° °Debridement  of nails  1-5  B/L with a nail nipper.  Nails were then filed using a dremel tool with no incidents.    RTC 3 months  ° ° °Brenda Horton DPM  °

## 2020-01-05 ENCOUNTER — Telehealth (INDEPENDENT_AMBULATORY_CARE_PROVIDER_SITE_OTHER): Payer: Medicare HMO | Admitting: Adult Health

## 2020-01-05 ENCOUNTER — Encounter: Payer: Self-pay | Admitting: Adult Health

## 2020-01-05 DIAGNOSIS — R197 Diarrhea, unspecified: Secondary | ICD-10-CM

## 2020-01-05 NOTE — Progress Notes (Signed)
MyChart Video Visit    Virtual Visit via Video Note   This visit type was conducted due to national recommendations for restrictions regarding the COVID-19 Pandemic (e.g. social distancing) in an effort to limit this patient's exposure and mitigate transmission in our community. This patient is at least at moderate risk for complications without adequate follow up. This format is felt to be most appropriate for this patient at this time. Physical exam was limited by quality of the video and audio technology used for the visit.   Patient location: at home  Provider location: Provider: Provider's office at  [facility], [place].     I discussed the limitations of evaluation and management by telemedicine and the availability of in person appointments. The patient expressed understanding and agreed to proceed.  Patient: Brenda Horton   DOB: 04/08/1929   86 y.o. Female  MRN: [id number] Visit Date: 01/05/2020  Today's healthcare provider: Marcille Buffy, FNP   Chief Complaint  Patient presents with  . Diarrhea   Subjective    Diarrhea  This is a new problem. The current episode started 1 to 4 weeks ago. The problem has been unchanged. The stool consistency is described as watery. The patient states that diarrhea does not awaken her from sleep. Associated symptoms include abdominal pain (x 1 day ), increased flatus and weight loss. Pertinent negatives include no arthralgias, bloating, chills, coughing, fever, headaches, myalgias, sweats, URI or vomiting. Nothing aggravates the symptoms. There are no known risk factors. Treatments tried: imodium.    Onset about two weeks.  She has increased her fruit intake and more pears. She has cut out coffee.  She reports she is having diarrhea once daily. Denies any bleeding. Denies any  Mucous, dark stools or blood.Denies any odor.  Denies any nausea. This has been going on for one month. Denies any fever.   She  reports for one day she had some lower abdominal cramping.   E coli in the past by enteropathogenic E  Coli stool culture was treated 05/13/2018.  Patient  denies any fever, body aches,chills, rash, chest pain, shortness of breath, nausea, vomiting/ Denies dizziness, lightheadedness, pre syncopal or syncopal episodes.   October 1st influenza shot.   Patient Active Problem List   Diagnosis Date Noted  . Pincer nail deformity 05/06/2019  . Hav (hallux abducto valgus), right 11/23/2018  . Colon polyps 08/24/2018  . Pain due to onychomycosis of toenails of both feet 08/24/2018  . Plantar fasciitis, left 08/24/2018  . Hypertension 03/09/2018  . Hypothyroidism 03/09/2018  . Diarrhea due to malabsorption 11/02/2015  . Osteopenia 10/22/2013   Past Medical History:  Diagnosis Date  . CKD (chronic kidney disease) stage 3, GFR 30-59 ml/min (HCC) 03/09/2018  . Hypertension   . Macular degeneration   . Thyroid disease    No Known Allergies    Medications: Outpatient Medications Prior to Visit  Medication Sig  . aspirin 81 MG tablet Take 81 mg by mouth daily.   . Coenzyme Q10 10 MG capsule Take 10 mg by mouth daily.   Marland Kitchen levothyroxine (SYNTHROID) 112 MCG tablet TAKE 1 TABLET DAILY BEFORE BREAKFAST  . lisinopril-hydrochlorothiazide (ZESTORETIC) 20-12.5 MG tablet TAKE 1 TABLET DAILY  . Multiple Vitamin (MULTIVITAMIN) capsule Take 1 capsule by mouth daily.   . Multiple Vitamins-Minerals (PRESERVISION AREDS 2) CAPS Take 2 capsules by mouth daily.   . Probiotic Product (ALIGN) 4 MG CAPS Take 4 mg by mouth daily.  . RESTASIS 0.05 %  ophthalmic emulsion   . SHINGRIX injection   . Biotin 2500 MCG CAPS Take 7,500 mcg by mouth daily. (Patient not taking: Reported on 01/05/2020)  . psyllium (METAMUCIL) 58.6 % packet Take 1 packet by mouth daily.  (Patient not taking: Reported on 01/05/2020)   No facility-administered medications prior to visit.    Review of Systems  Constitutional: Positive for  weight loss. Negative for chills and fever.  HENT: Negative.   Respiratory: Negative.  Negative for cough.   Cardiovascular: Negative.   Gastrointestinal: Positive for abdominal pain (x 1 day ), diarrhea and flatus. Negative for abdominal distention, anal bleeding, bloating, blood in stool, constipation, nausea, rectal pain and vomiting.  Musculoskeletal: Negative for arthralgias and myalgias.  Skin: Negative.   Neurological: Negative for headaches.  Psychiatric/Behavioral: Negative.     Last CBC Lab Results  Component Value Date   WBC 5.2 08/03/2019   HGB 13.0 08/03/2019   HCT 39.1 08/03/2019   MCV 103 (H) 08/03/2019   MCH 34.1 (H) 08/03/2019   RDW 13.4 08/03/2019   PLT 212 02/58/5277   Last metabolic panel Lab Results  Component Value Date   GLUCOSE 93 08/03/2019   NA 141 08/03/2019   K 4.3 08/03/2019   CL 103 08/03/2019   CO2 22 08/03/2019   BUN 31 (H) 08/03/2019   CREATININE 1.08 (H) 08/03/2019   GFRNONAA 46 (L) 08/03/2019   GFRAA 53 (L) 08/03/2019   CALCIUM 9.6 08/03/2019   PROT 6.7 08/03/2019   ALBUMIN 4.3 08/03/2019   LABGLOB 2.4 08/03/2019   AGRATIO 1.8 08/03/2019   BILITOT 0.5 08/03/2019   ALKPHOS 30 (L) 08/03/2019   AST 19 08/03/2019   ALT 16 08/03/2019      Objective    There were no vitals taken for this visit.    Physical Exam    Patient is alert and oriented and responsive to questions Engages in conversation with provider. Speaks in full sentences without any pauses without any shortness of breath or distress.  No vital signs available.   Assessment & Plan     Diarrhea, unspecified type - Plan: CBC with Differential/Platelet, Comprehensive Metabolic Panel (CMET), Cdiff NAA+O+P+Stool Culture  She has eliminated coffee, not dairy. Discussed dietary changes. Diarrhea is mild once daily.  Try reducing fiber, since recently increased pears.  The 'BRAT' diet is suggested, then progress to diet as tolerated as symptoms abate. Call if bloody  stools, persistent diarrhea, vomiting, fever or abdominal pain.  Stool testing and labs advised. She will likely need GI consult pending these results.  Return in about 1 week (around 01/12/2020), or if symptoms worsen or fail to improve, for at any time for any worsening symptoms, Go to Emergency room/ urgent care if worse.     I discussed the assessment and treatment plan with the patient. The patient was provided an opportunity to ask questions and all were answered. The patient agreed with the plan and demonstrated an understanding of the instructions.   The patient was advised to call back or seek an in-person evaluation if the symptoms worsen or if the condition fails to improve as anticipated.  I provided 32  minutes of non-face-to-face time during this encounter.  I discussed the limitations of evaluation and management by telemedicine and the availability of in person appointments. The patient expressed understanding and agreed to proceed.   Marcille Buffy, [place] [phone] (phone) [phone] (fax)  [place]

## 2020-01-05 NOTE — Patient Instructions (Signed)
Bland Diet A bland diet consists of foods that are often soft and do not have a lot of fat, fiber, or extra seasonings. Foods without fat, fiber, or seasoning are easier for the body to digest. They are also less likely to irritate your mouth, throat, stomach, and other parts of your digestive system. A bland diet is sometimes called a BRAT diet. What is my plan? Your health care provider or food and nutrition specialist (dietitian) may recommend specific changes to your diet to prevent symptoms or to treat your symptoms. These changes may include:  Eating small meals often.  Cooking food until it is soft enough to chew easily.  Chewing your food well.  Drinking fluids slowly.  Not eating foods that are very spicy, sour, or fatty.  Not eating citrus fruits, such as oranges and grapefruit. What do I need to know about this diet?  Eat a variety of foods from the bland diet food list.  Do not follow a bland diet longer than needed.  Ask your health care provider whether you should take vitamins or supplements. What foods can I eat? Grains  Hot cereals, such as cream of wheat. Rice. Bread, crackers, or tortillas made from refined white flour. Vegetables Canned or cooked vegetables. Mashed or boiled potatoes. Fruits  Bananas. Applesauce. Other types of cooked or canned fruit with the skin and seeds removed, such as canned peaches or pears. Meats and other proteins  Scrambled eggs. Creamy peanut butter or other nut butters. Lean, well-cooked meats, such as chicken or fish. Tofu. Soups or broths. Dairy Low-fat dairy products, such as milk, cottage cheese, or yogurt. Beverages  Water. Herbal tea. Apple juice. Fats and oils Mild salad dressings. Canola or olive oil. Sweets and desserts Pudding. Custard. Fruit gelatin. Ice cream. The items listed above may not be a complete list of recommended foods and beverages. Contact a dietitian for more options. What foods are not  recommended? Grains Whole grain breads and cereals. Vegetables Raw vegetables. Fruits Raw fruits, especially citrus, berries, or dried fruits. Dairy Whole fat dairy foods. Beverages Caffeinated drinks. Alcohol. Seasonings and condiments Strongly flavored seasonings or condiments. Hot sauce. Salsa. Other foods Spicy foods. Fried foods. Sour foods, such as pickled or fermented foods. Foods with high sugar content. Foods high in fiber. The items listed above may not be a complete list of foods and beverages to avoid. Contact a dietitian for more information. Summary  A bland diet consists of foods that are often soft and do not have a lot of fat, fiber, or extra seasonings.  Foods without fat, fiber, or seasoning are easier for the body to digest.  Check with your health care provider to see how long you should follow this diet plan. It is not meant to be followed for long periods. This information is not intended to replace advice given to you by your health care provider. Make sure you discuss any questions you have with your health care provider. Document Revised: 03/26/2017 Document Reviewed: 03/26/2017 Elsevier Patient Education  2020 [place] Choices to Help Relieve Diarrhea, Adult When you have diarrhea, the foods you eat and your eating habits are very important. Choosing the right foods and drinks can help:  Relieve diarrhea.  Replace lost fluids and nutrients.  Prevent dehydration. What general guidelines should I follow?  Relieving diarrhea  Choose foods with less than 2 g or .07 oz. of fiber per serving.  Limit fats to less than 8 tsp (38 g or  1.34 oz.) a day.  Avoid the following: ? Foods and beverages sweetened with high-fructose corn syrup, honey, or sugar alcohols such as xylitol, sorbitol, and mannitol. ? Foods that contain a lot of fat or sugar. ? Fried, greasy, or spicy foods. ? High-fiber grains, breads, and cereals. ? Raw fruits and  vegetables.  Eat foods that are rich in probiotics. These foods include dairy products such as yogurt and fermented milk products. They help increase healthy bacteria in the stomach and intestines (gastrointestinal tract, or GI tract).  If you have lactose intolerance, avoid dairy products. These may make your diarrhea worse.  Take medicine to help stop diarrhea (antidiarrheal medicine) only as told by your health care provider. Replacing nutrients  Eat small meals or snacks every 3-4 hours.  Eat bland foods, such as white rice, toast, or baked potato, until your diarrhea starts to get better. Gradually reintroduce nutrient-rich foods as tolerated or as told by your health care provider. This includes: ? Well-cooked protein foods. ? Peeled, seeded, and soft-cooked fruits and vegetables. ? Low-fat dairy products.  Take vitamin and mineral supplements as told by your health care provider. Preventing dehydration  Start by sipping water or a special solution to prevent dehydration (oral rehydration solution, ORS). Urine that is clear or pale yellow means that you are getting enough fluid.  Try to drink at least 8-10 cups of fluid each day to help replace lost fluids.  You may add other liquids in addition to water, such as clear juice or decaffeinated sports drinks, as tolerated or as told by your health care provider.  Avoid drinks with caffeine, such as coffee, tea, or soft drinks.  Avoid alcohol. What foods are recommended?     The items listed may not be a complete list. Talk with your health care provider about what dietary choices are best for you. Grains White rice. White, [place], or pita breads (fresh or toasted), including plain rolls, buns, or bagels. White pasta. Saltine, soda, or graham crackers. Pretzels. Low-fiber cereal. Cooked cereals made with water (such as cornmeal, farina, or cream cereals). Plain muffins. Matzo. Melba toast. Zwieback. Vegetables Potatoes (without  the skin). Most well-cooked and canned vegetables without skins or seeds. Tender lettuce. Fruits Apple sauce. Fruits canned in juice. Cooked apricots, cherries, grapefruit, peaches, pears, or plums. Fresh bananas and cantaloupe. Meats and other protein foods Baked or boiled chicken. Eggs. Tofu. Fish. Seafood. Smooth nut butters. Ground or well-cooked tender beef, ham, veal, lamb, pork, or poultry. Dairy Plain yogurt, kefir, and unsweetened liquid yogurt. Lactose-free milk, buttermilk, skim milk, or soy milk. Low-fat or nonfat hard cheese. Beverages Water. Low-calorie sports drinks. Fruit juices without pulp. Strained tomato and vegetable juices. Decaffeinated teas. Sugar-free beverages not sweetened with sugar alcohols. Oral rehydration solutions, if approved by your health care provider. Seasoning and other foods Bouillon, broth, or soups made from recommended foods. What foods are not recommended? The items listed may not be a complete list. Talk with your health care provider about what dietary choices are best for you. Grains Whole grain, whole wheat, bran, or rye breads, rolls, pastas, and crackers. Wild or brown rice. Whole grain or bran cereals. Barley. Oats and oatmeal. Corn tortillas or taco shells. Granola. Popcorn. Vegetables Raw vegetables. Fried vegetables. Cabbage, broccoli, Brussels sprouts, artichokes, baked beans, beet greens, corn, kale, legumes, peas, sweet potatoes, and yams. Potato skins. Cooked spinach and cabbage. Fruits Dried fruit, including raisins and dates. Raw fruits. Stewed or dried prunes. Canned fruits with syrup.  Meat and other protein foods Fried or fatty meats. Deli meats. Chunky nut butters. Nuts and seeds. Beans and lentils. Berniece Salines. Hot dogs. Sausage. Dairy High-fat cheeses. Whole milk, chocolate milk, and beverages made with milk, such as milk shakes. Half-and-half. Cream. sour cream. Ice cream. Beverages Caffeinated beverages (such as coffee, tea, soda,  or energy drinks). Alcoholic beverages. Fruit juices with pulp. Prune juice. Soft drinks sweetened with high-fructose corn syrup or sugar alcohols. High-calorie sports drinks. Fats and oils Butter. Cream sauces. Margarine. Salad oils. Plain salad dressings. Olives. Avocados. Mayonnaise. Sweets and desserts Sweet rolls, doughnuts, and sweet breads. Sugar-free desserts sweetened with sugar alcohols such as xylitol and sorbitol. Seasoning and other foods Honey. Hot sauce. Chili powder. Gravy. Cream-based or milk-based soups. Pancakes and waffles. Summary  When you have diarrhea, the foods you eat and your eating habits are very important.  Make sure you get at least 8-10 cups of fluid each day, or enough to keep your urine clear or pale yellow.  Eat bland foods and gradually reintroduce healthy, nutrient-rich foods as tolerated, or as told by your health care provider.  Avoid high-fiber, fried, greasy, or spicy foods. This information is not intended to replace advice given to you by your health care provider. Make sure you discuss any questions you have with your health care provider. Document Revised: 06/18/2018 Document Reviewed: 02/23/2016 Elsevier Patient Education  [place]. Diarrhea, Adult Diarrhea is when you pass loose and watery poop (stool) often. Diarrhea can make you feel weak and cause you to lose water in your body (get dehydrated). Losing water in your body can cause you to:  Feel tired and thirsty.  Have a dry mouth.  Go pee (urinate) less often. Diarrhea often lasts 2-3 days. However, it can last longer if it is a sign of something more serious. It is important to treat your diarrhea as told by your doctor. Follow these instructions at home: Eating and drinking     Follow these instructions as told by your doctor:  Take an ORS (oral rehydration solution). This is a drink that helps you replace fluids and minerals your body lost. It is sold at pharmacies and  stores.  Drink plenty of fluids, such as: ? Water. ? Ice chips. ? Diluted fruit juice. ? Low-calorie sports drinks. ? Milk, if you want.  Avoid drinking fluids that have a lot of sugar or caffeine in them.  Eat bland, easy-to-digest foods in small amounts as you are able. These foods include: ? Bananas. ? Applesauce. ? Rice. ? Low-fat (lean) meats. ? Toast. ? Crackers.  Avoid alcohol.  Avoid spicy or fatty foods.  Medicines  Take over-the-counter and prescription medicines only as told by your doctor.  If you were prescribed an antibiotic medicine, take it as told by your doctor. Do not stop using the antibiotic even if you start to feel better. General instructions   Wash your hands often using soap and water. If soap and water are not available, use a hand sanitizer. Others in your home should wash their hands as well. Hands should be washed: ? After using the toilet or changing a diaper. ? Before preparing, cooking, or serving food. ? While caring for a sick person. ? While visiting someone in a hospital.  Drink enough fluid to keep your pee (urine) pale yellow.  Rest at home while you get better.  Watch your condition for any changes.  Take a warm bath to help with any burning or pain  from having diarrhea.  Keep all follow-up visits as told by your doctor. This is important. Contact a doctor if:  You have a fever.  Your diarrhea gets worse.  You have new symptoms.  You cannot keep fluids down.  You feel light-headed or dizzy.  You have a headache.  You have muscle cramps. Get help right away if:  You have chest pain.  You feel very weak or you pass out (faint).  You have bloody or black poop or poop that looks like tar.  You have very bad pain, cramping, or bloating in your belly (abdomen).  You have trouble breathing or you are breathing very quickly.  Your heart is beating very quickly.  Your skin feels cold and clammy.  You feel  confused.  You have signs of losing too much water in your body, such as: ? Dark pee, very little pee, or no pee. ? Cracked lips. ? Dry mouth. ? Sunken eyes. ? Sleepiness. ? Weakness. Summary  Diarrhea is when you pass loose and watery poop (stool) often.  Diarrhea can make you feel weak and cause you to lose water in your body (get dehydrated).  Take an ORS (oral rehydration solution). This is a drink that is sold at pharmacies and stores.  Eat bland, easy-to-digest foods in small amounts as you are able.  Contact a doctor if your condition gets worse. Get help right away if you have signs that you have lost too much water in your body. This information is not intended to replace advice given to you by your health care provider. Make sure you discuss any questions you have with your health care provider. Document Revised: 08/01/2017 Document Reviewed: 08/01/2017 Elsevier Patient Education  [place].

## 2020-01-06 LAB — COMPREHENSIVE METABOLIC PANEL
ALT: 15 IU/L (ref 0–32)
AST: 17 IU/L (ref 0–40)
Albumin/Globulin Ratio: 2 (ref 1.2–2.2)
Albumin: 4.4 g/dL (ref 3.6–4.6)
Alkaline Phosphatase: 31 IU/L — ABNORMAL LOW (ref 44–121)
BUN/Creatinine Ratio: 24 (ref 12–28)
BUN: 27 mg/dL (ref 8–27)
Bilirubin Total: 0.4 mg/dL (ref 0.0–1.2)
CO2: 21 mmol/L (ref 20–29)
Calcium: 9.5 mg/dL (ref 8.7–10.3)
Chloride: 101 mmol/L (ref 96–106)
Creatinine, Ser: 1.11 mg/dL — ABNORMAL HIGH (ref 0.57–1.00)
GFR calc Af Amer: 51 mL/min/{1.73_m2} — ABNORMAL LOW (ref >59)
GFR calc non Af Amer: 44 mL/min/{1.73_m2} — ABNORMAL LOW (ref >59)
Globulin, Total: 2.2 g/dL (ref 1.5–4.5)
Glucose: 99 mg/dL (ref 65–99)
Potassium: 3.9 mmol/L (ref 3.5–5.2)
Sodium: 140 mmol/L (ref 134–144)
Total Protein: 6.6 g/dL (ref 6.0–8.5)

## 2020-01-06 LAB — CBC WITH DIFFERENTIAL/PLATELET
Basophils Absolute: 0.1 10*3/uL (ref 0.0–0.2)
Basos: 1 %
EOS (ABSOLUTE): 0.3 10*3/uL (ref 0.0–0.4)
Eos: 4 %
Hematocrit: 40 % (ref 34.0–46.6)
Hemoglobin: 13 g/dL (ref 11.1–15.9)
Immature Grans (Abs): 0.1 10*3/uL (ref 0.0–0.1)
Immature Granulocytes: 2 %
Lymphocytes Absolute: 1.6 10*3/uL (ref 0.7–3.1)
Lymphs: 28 %
MCH: 34.3 pg — ABNORMAL HIGH (ref 26.6–33.0)
MCHC: 32.5 g/dL (ref 31.5–35.7)
MCV: 106 fL — ABNORMAL HIGH (ref 79–97)
Monocytes Absolute: 0.8 10*3/uL (ref 0.1–0.9)
Monocytes: 14 %
Neutrophils Absolute: 2.9 10*3/uL (ref 1.4–7.0)
Neutrophils: 51 %
Platelets: 214 10*3/uL (ref 150–450)
RBC: 3.79 x10E6/uL (ref 3.77–5.28)
RDW: 13.4 % (ref 11.7–15.4)
WBC: 5.6 10*3/uL (ref 3.4–10.8)

## 2020-01-12 LAB — CDIFF NAA+O+P+STOOL CULTURE
E coli, Shiga toxin Assay: NEGATIVE
Toxigenic C. Difficile by PCR: NEGATIVE

## 2020-01-12 NOTE — Progress Notes (Signed)
Negative stool culture, if symptoms persist will need GI consult. Follow up if symptoms persist, worsen at anytime,. Ok to place GI referral if needed.

## 2020-01-13 LAB — VITAMIN B12: Vitamin B-12: 886 pg/mL (ref 232–1245)

## 2020-01-13 LAB — SPECIMEN STATUS REPORT

## 2020-01-13 NOTE — Progress Notes (Signed)
B12 within normal limits

## 2020-01-14 ENCOUNTER — Telehealth: Payer: Self-pay

## 2020-01-14 DIAGNOSIS — R197 Diarrhea, unspecified: Secondary | ICD-10-CM

## 2020-01-14 NOTE — Telephone Encounter (Signed)
Referral to GI placed

## 2020-01-14 NOTE — Telephone Encounter (Signed)
Copied from [place] [id number]. Topic: General - Inquiry >> Jan 14, 2020 11:52 AM Alease Frame wrote: Reason for CRM: Pt is calling to let Brenda Horton know that diarrhea is still continuing and she would for her to go ahead and contact GI for referral of possible . Please advise

## 2020-01-17 NOTE — Telephone Encounter (Signed)
Patient was advised and I explained to patient some one will reach out to her for appointment.

## 2020-01-18 ENCOUNTER — Other Ambulatory Visit: Payer: Self-pay | Admitting: Physician Assistant

## 2020-01-18 DIAGNOSIS — I10 Essential (primary) hypertension: Secondary | ICD-10-CM

## 2020-01-18 NOTE — Telephone Encounter (Signed)
Requested Prescriptions  Pending Prescriptions Disp Refills  . lisinopril-hydrochlorothiazide (ZESTORETIC) 20-12.5 MG tablet [Pharmacy Med Name: LISINOP/HCTZ TAB 20-12.5] 90 tablet 0    Sig: TAKE 1 TABLET DAILY     Cardiovascular:  ACEI + Diuretic Combos Failed - 01/18/2020 10:49 AM      Failed - Cr in normal range and within 180 days    Creatinine, Ser  Date Value Ref Range Status  01/05/2020 1.11 (H) 0.57 - 1.00 mg/dL Final         Passed - Na in normal range and within 180 days    Sodium  Date Value Ref Range Status  01/05/2020 140 134 - 144 mmol/L Final         Passed - K in normal range and within 180 days    Potassium  Date Value Ref Range Status  01/05/2020 3.9 3.5 - 5.2 mmol/L Final         Passed - Ca in normal range and within 180 days    Calcium  Date Value Ref Range Status  01/05/2020 9.5 8.7 - 10.3 mg/dL Final         Passed - Patient is not pregnant      Passed - Last BP in normal range    BP Readings from Last 1 Encounters:  08/03/19 138/78         Passed - Valid encounter within last 6 months    Recent Outpatient Visits          1 week ago Diarrhea, unspecified type   [place], Kelby Aline, FNP   5 months ago [place], [place], [place]   12 months ago Diarrhea, unspecified type   [facility] [place], [place], [place]   1 year ago Hypertension, unspecified type   [facility] [place], [place], [place]   1 year ago Diarrhea, unspecified type   [facility] [place], [place]ee [place], [place]      Future Appointments            In 1 week Trinna Post, [place], [facility]

## 2020-01-24 NOTE — Progress Notes (Signed)
Subjective:   Brenda Horton is a 86 y.o. female who presents for Medicare Annual (Subsequent) preventive examination.  Review of Systems    N/A  Cardiac Risk Factors include: advanced age (>73men, >24 women);hypertension     Objective:    Today's Vitals   01/25/20 1035  BP: (!) 140/56  Pulse: 67  Temp: 97.9 F (36.6 C)  TempSrc: Oral  SpO2: 99%  Weight: 166 lb 9.6 oz (75.6 kg)  Height: 5\' 1"  (1.549 m)  PainSc: 0-No pain   Body mass index is 31.48 kg/m.  Advanced Directives 01/25/2020 01/20/2019 01/13/2018  Does Patient Have a Medical Advance Directive? Yes Yes Yes  Type of Paramedic of [place];Living will [place];Living will [place];Living will  Copy of [place] in Chart? No - copy requested No - copy requested Yes    Current Medications (verified) Outpatient Encounter Medications as of 01/25/2020  Medication Sig  . aspirin 81 MG tablet Take 81 mg by mouth daily.   . Coenzyme Q10 10 MG capsule Take 10 mg by mouth daily.   Marland Kitchen levothyroxine (SYNTHROID) 112 MCG tablet TAKE 1 TABLET DAILY BEFORE BREAKFAST  . lisinopril-hydrochlorothiazide (ZESTORETIC) 20-12.5 MG tablet TAKE 1 TABLET DAILY  . Multiple Vitamin (MULTIVITAMIN) capsule Take 1 capsule by mouth daily.   . Multiple Vitamins-Minerals (PRESERVISION AREDS 2) CAPS Take 2 capsules by mouth daily.   . Probiotic Product (ALIGN) 4 MG CAPS Take 4 mg by mouth daily.  . RESTASIS 0.05 % ophthalmic emulsion Place 1 drop into both eyes 2 (two) times daily.   . Biotin 2500 MCG CAPS Take 7,500 mcg by mouth daily. (Patient not taking: Reported on 01/05/2020)  . psyllium (METAMUCIL) 58.6 % packet Take 1 packet by mouth daily.  (Patient not taking: Reported on 01/05/2020)  . Lebo injection  (Patient not taking: Reported on 01/25/2020)   No facility-administered encounter medications on file as of 01/25/2020.    Allergies  (verified) Patient has no known allergies.   History: Past Medical History:  Diagnosis Date  . CKD (chronic kidney disease) stage 3, GFR 30-59 ml/min (HCC) 03/09/2018  . Hypertension   . Macular degeneration   . Thyroid disease    Past Surgical History:  Procedure Laterality Date  . ABDOMINAL HYSTERECTOMY     Partial  . EYE SURGERY    . FRACTURE SURGERY     Family History  Problem Relation Age of Onset  . Hyperthyroidism Mother   . Hypertension Father   . Hyperthyroidism Daughter   . Arthritis Daughter   . Rheum arthritis Daughter   . Hyperthyroidism Son    Social History   Socioeconomic History  . Marital status: Widowed    Spouse name: Not on file  . Number of children: 5  . Years of education: Not on file  . Highest education level: Some college, no degree  Occupational History  . Occupation: retired  Tobacco Use  . Smoking status: Never Smoker  . Smokeless tobacco: Never Used  Vaping Use  . Vaping Use: Never used  Substance and Sexual Activity  . Alcohol use: Yes    Comment: maybe 2 glasses of wine a month  . Drug use: Never  . Sexual activity: Not on file  Other Topics Concern  . Not on file  Social History Narrative  . Not on file   Social Determinants of Health   Financial Resource Strain: Low Risk   . Difficulty of Paying  Living Expenses: Not hard at all  Food Insecurity: No Food Insecurity  . Worried About Charity fundraiser in the Last Year: Never true  . Ran Out of Food in the Last Year: Never true  Transportation Needs: No Transportation Needs  . Lack of Transportation (Medical): No  . Lack of Transportation (Non-Medical): No  Physical Activity: Sufficiently Active  . Days of Exercise per Week: 5 days  . Minutes of Exercise per Session: 60 min  Stress: Stress Concern Present  . Feeling of Stress : To some extent  Social Connections: Socially Isolated  . Frequency of Communication with Friends and Family: More than three times a week  .  Frequency of Social Gatherings with Friends and Family: More than three times a week  . Attends Religious Services: Never  . Active Member of Clubs or Organizations: No  . Attends Archivist Meetings: Never  . Marital Status: Widowed    Tobacco Counseling Counseling given: Not Answered   Clinical Intake:  Pre-visit preparation completed: Yes  Pain : No/denies pain Pain Score: 0-No pain     Nutritional Status: BMI > 30  Obese Nutritional Risks: Nausea/ vomitting/ diarrhea (Diarrhea- pt is seeing GI for issues tomomorrow,) Diabetes: No  How often do you need to have someone help you when you read instructions, pamphlets, or other written materials from your doctor or pharmacy?: 1 - Never  Diabetic? No  Interpreter Needed?: No  Information entered by :: [facility], LPN   Activities of Daily Living In your present state of health, do you have any difficulty performing the following activities: 01/25/2020  Hearing? N  Vision? N  Difficulty concentrating or making decisions? N  Walking or climbing stairs? N  Dressing or bathing? N  Doing errands, shopping? N  Preparing Food and eating ? N  Using the Toilet? N  In the past six months, have you accidently leaked urine? Y  Comment Occasionally, wears protection at all times.  Do you have problems with loss of bowel control? Y  Comment Due to current diarrhea issue. Will see GI tomorrow.  Managing your Medications? N  Managing your Finances? N  Housekeeping or managing your Housekeeping? N  Some recent data might be hidden    Patient Care Team: Paulene Floor as PCP - General (Physician Assistant) Isaias Sakai, MD as Referring Physician (Ophthalmology) Gardiner Barefoot, DPM as Consulting Physician (Podiatry) Virgel Manifold, MD as Consulting Physician (Gastroenterology)  Indicate any recent Medical Services you may have received from other than [facility] providers in the past year (date may be  approximate).     Assessment:   This is a routine wellness examination for [place].  Hearing/Vision screen No exam data present  Dietary issues and exercise activities discussed: Current Exercise Habits: Home exercise routine, Type of exercise: walking, Time (Minutes): 60, Frequency (Times/Week): 5, Weekly Exercise (Minutes/Week): 300, Intensity: Mild, Exercise limited by: None identified  Goals    . DIET - REDUCE CALORIE INTAKE     Continue current FODMAP diet plan to avoid certain foods to help with IBS symptoms.      Depression Screen PHQ 2/9 Scores 01/25/2020 01/20/2019 01/13/2018 12/24/2017 12/24/2017  PHQ - 2 Score 0 0 0 0 0    Fall Risk Fall Risk  01/25/2020 01/20/2019 08/12/2018 01/13/2018 12/24/2017  Falls in the past year? 0 0 0 0 No  Number falls in past yr: 0 0 - - -  Injury with Fall? 0 0 - - -  Any stairs in or around the home? No  If so, are there any without handrails? No  Home free of loose throw rugs in walkways, pet beds, electrical cords, etc? Yes  Adequate lighting in your home to reduce risk of falls? Yes   ASSISTIVE DEVICES UTILIZED TO PREVENT FALLS:  Life alert? No  Use of a cane, walker or w/c? No  Grab bars in the bathroom? Yes  Shower chair or bench in shower? Yes  Elevated toilet seat or a handicapped toilet? Yes   TIMED UP AND GO:  Was the test performed? Yes .  Length of time to ambulate 10 feet: 12 sec.   Gait slow and steady without use of assistive device  Cognitive Function: Declined today.         Immunizations Immunization History  Administered Date(s) Administered  . Fluad Quad(high Dose 65+) 11/17/2018, 01/19/2019  . Influenza, High Dose Seasonal PF 12/14/2012, 11/24/2014, 12/11/2015, 12/11/2016, 12/24/2017, 12/10/2019  . Influenza, Seasonal, Injecte, Preservative Fre 12/09/2013  . Moderna SARS-COVID-2 Vaccination 04/24/2019, 05/23/2019  . Pneumococcal Conjugate-13 10/18/2014  . Pneumococcal Polysaccharide-23 03/12/1999   . Tdap 12/26/2016  . Zoster 03/11/2005  . Zoster Recombinat (Shingrix) 04/15/2018, 04/28/2018, 11/17/2018    TDAP status: Up to date Flu Vaccine status: Up to date Pneumococcal vaccine status: Up to date Covid-19 vaccine status: Completed vaccines  Qualifies for Shingles Vaccine? Yes   Zostavax completed Yes   Shingrix Completed?: Yes  Screening Tests Health Maintenance  Topic Date Due  . DEXA SCAN  11/05/2020  . TETANUS/TDAP  12/27/2026  . INFLUENZA VACCINE  Completed  . COVID-19 Vaccine  Completed  . PNA vac Low Risk Adult  Completed    Health Maintenance  There are no preventive care reminders to display for this patient.  Colorectal cancer screening: No longer required.  Mammogram status: No longer required.  Bone Density status: Completed 11/06/15. Results reflect: Bone density results: OSTEOPENIA. Repeat every 5 years.  Lung Cancer Screening: (Low Dose CT Chest recommended if Age 58-80 years, 30 pack-year currently smoking OR have quit w/in 15years.) does not qualify.   Additional Screening:  Vision Screening: Recommended annual ophthalmology exams for early detection of glaucoma and other disorders of the eye. Is the patient up to date with their annual eye exam?  Yes  Who is the provider or what is the name of the office in which the patient attends annual eye exams? Dr Roosevelt Locks @ [facility] If pt is not established with a provider, would they like to be referred to a provider to establish care? No .   Dental Screening: Recommended annual dental exams for proper oral hygiene  Community Resource Referral / Chronic Care Management: CRR required this visit?  No   CCM required this visit?  No      Plan:     I have personally reviewed and noted the following in the patient's chart:   . Medical and social history . Use of alcohol, tobacco or illicit drugs  . Current medications and supplements . Functional ability and status . Nutritional status . Physical  activity . Advanced directives . List of other physicians . Hospitalizations, surgeries, and ER visits in previous 12 months . Vitals . Screenings to include cognitive, depression, and falls . Referrals and appointments  In addition, I have reviewed and discussed with patient certain preventive protocols, quality metrics, and best practice recommendations. A written personalized care plan for preventive services as well as general preventive health recommendations were provided to patient.  Brenda Horton, [place]   34/08/8401   Nurse Notes: None.

## 2020-01-25 ENCOUNTER — Ambulatory Visit (INDEPENDENT_AMBULATORY_CARE_PROVIDER_SITE_OTHER): Payer: Medicare HMO | Admitting: Physician Assistant

## 2020-01-25 ENCOUNTER — Ambulatory Visit: Payer: Medicare HMO

## 2020-01-25 ENCOUNTER — Other Ambulatory Visit: Payer: Self-pay

## 2020-01-25 ENCOUNTER — Encounter: Payer: Self-pay | Admitting: Physician Assistant

## 2020-01-25 VITALS — BP 140/56 | HR 67 | Temp 97.9°F | Ht 61.0 in | Wt 166.6 lb

## 2020-01-25 DIAGNOSIS — Z0000 Encounter for general adult medical examination without abnormal findings: Secondary | ICD-10-CM | POA: Diagnosis not present

## 2020-01-25 DIAGNOSIS — E039 Hypothyroidism, unspecified: Secondary | ICD-10-CM | POA: Diagnosis not present

## 2020-01-25 DIAGNOSIS — I10 Essential (primary) hypertension: Secondary | ICD-10-CM | POA: Diagnosis not present

## 2020-01-25 NOTE — Progress Notes (Signed)
Complete physical exam   Patient: Brenda Horton   DOB: 04-22-1929   86 y.o. Female  MRN: [id number] Visit Date: 01/25/2020  Today's healthcare provider: Trinna Post, PA-C   Chief Complaint  Patient presents with  . Annual Exam  . Hypertension  . Hypothyroidism  I,Brenda Horton,acting as a scribe for [employer], PA-C.,have documented all relevant documentation on the behalf of Brenda Post, PA-C,as directed by  Brenda Post, PA-C while in the presence of Brenda Post, PA-C.  Subjective    Brenda Horton is a 86 y.o. female who presents today for a complete physical exam.  She reports consuming a general diet. Home exercise routine includes walk 2 miles 5 days a week. She generally feels well. She reports sleeping well. She does  have additional problems to discuss today.  HPI   She currently has five children all living in the state of [place] - [place], [place], [place], [place] and [place].  Hypertension, follow-up  BP Readings from Last 3 Encounters:  01/25/20 (!) 140/56  01/25/20 (!) 140/56  08/03/19 138/78   Wt Readings from Last 3 Encounters:  01/25/20 166 lb 9.6 oz (75.6 kg)  01/25/20 166 lb 9.6 oz (75.6 kg)  08/03/19 167 lb 12.8 oz (76.1 kg)     She was last seen for hypertension 15 months ago.  BP at that visit was controlled. Management since that visit includes continue current medication.  She reports good compliance with treatment. She is not having side effects.  She is following a Regular diet. She is exercising. She does not smoke.  Use of agents associated with hypertension: none.   Outside blood pressures are not being checked. Symptoms: No chest pain No chest pressure  No palpitations No syncope  No dyspnea No orthopnea  No paroxysmal nocturnal dyspnea No lower extremity edema   Pertinent labs: Lab Results  Component Value Date   CHOL 183 08/03/2019   HDL 61 08/03/2019   LDLCALC 104 (H) 08/03/2019    TRIG 103 08/03/2019   CHOLHDL 3.0 08/03/2019   Lab Results  Component Value Date   NA 140 01/05/2020   K 3.9 01/05/2020   CREATININE 1.11 (H) 01/05/2020   GFRNONAA 44 (L) 01/05/2020   GFRAA 51 (L) 01/05/2020   GLUCOSE 99 01/05/2020     The ASCVD Risk score (Brenda Horton., et al., 2013) failed to calculate for the following reasons:   The 2013 ASCVD risk score is only valid for ages 50 to 33   --------------------------------------------------------------------------------------------------- Hypothyroid, follow-up  Lab Results  Component Value Date   TSH 0.866 08/03/2019   TSH 0.909 12/24/2017   Wt Readings from Last 3 Encounters:  01/25/20 166 lb 9.6 oz (75.6 kg)  01/25/20 166 lb 9.6 oz (75.6 kg)  08/03/19 167 lb 12.8 oz (76.1 kg)    She was last seen for hypothyroid 15 months ago.  Management since that visit includes continue current medication. She reports good compliance with treatment. She is not having side effects.   Symptoms: No change in energy level No constipation  No diarrhea No heat / cold intolerance  No nervousness No palpitations  No weight changes    -----------------------------------------------------------------------------------------  She has been having chronic issues with diarrhea. She has been referred to gastroenterology and has an appointment on 01/26/2020 with Brenda Horton.   Past Medical History:  Diagnosis Date  . CKD (chronic kidney disease) stage 3, GFR 30-59 ml/min (HCC) 03/09/2018  .  Hypertension   . Macular degeneration   . Thyroid disease    Past Surgical History:  Procedure Laterality Date  . ABDOMINAL HYSTERECTOMY     Partial  . EYE SURGERY    . FRACTURE SURGERY     Social History   Socioeconomic History  . Marital status: Widowed    Spouse name: Not on file  . Number of children: 5  . Years of education: Not on file  . Highest education level: Some college, no degree  Occupational History  . Occupation: retired   Tobacco Use  . Smoking status: Never Smoker  . Smokeless tobacco: Never Used  Vaping Use  . Vaping Use: Never used  Substance and Sexual Activity  . Alcohol use: Yes    Comment: maybe 2 glasses of wine a month  . Drug use: Never  . Sexual activity: Not on file  Other Topics Concern  . Not on file  Social History Narrative  . Not on file   Social Determinants of Health   Financial Resource Strain: Low Risk   . Difficulty of Paying Living Expenses: Not hard at all  Food Insecurity: No Food Insecurity  . Worried About Charity fundraiser in the Last Year: Never true  . Ran Out of Food in the Last Year: Never true  Transportation Needs: No Transportation Needs  . Lack of Transportation (Medical): No  . Lack of Transportation (Non-Medical): No  Physical Activity: Sufficiently Active  . Days of Exercise per Week: 5 days  . Minutes of Exercise per Session: 60 min  Stress: Stress Concern Present  . Feeling of Stress : To some extent  Social Connections: Socially Isolated  . Frequency of Communication with Friends and Family: More than three times a week  . Frequency of Social Gatherings with Friends and Family: More than three times a week  . Attends Religious Services: Never  . Active Member of Clubs or Organizations: No  . Attends Archivist Meetings: Never  . Marital Status: Widowed  Intimate Partner Violence: Not At Risk  . Fear of Current or Ex-Partner: No  . Emotionally Abused: No  . Physically Abused: No  . Sexually Abused: No   Family Status  Relation Name Status  . Mother  Deceased  . Father  Deceased  . Daughter  Alive  . Son  Alive  . Son  Alive  . Son  Alive  . Son  Alive   Family History  Problem Relation Age of Onset  . Hyperthyroidism Mother   . Hypertension Father   . Hyperthyroidism Daughter   . Arthritis Daughter   . Rheum arthritis Daughter   . Hyperthyroidism Son    No Known Allergies  Patient Care Team: Brenda Horton as  PCP - General (Physician Assistant) Brenda Sakai, MD as Referring Physician (Ophthalmology) Brenda Horton, DPM as Consulting Physician (Podiatry) Brenda Manifold, MD as Consulting Physician (Gastroenterology)   Medications: Outpatient Medications Prior to Visit  Medication Sig  . aspirin 81 MG tablet Take 81 mg by mouth daily.   . Coenzyme Q10 10 MG capsule Take 10 mg by mouth daily.   Marland Kitchen levothyroxine (SYNTHROID) 112 MCG tablet TAKE 1 TABLET DAILY BEFORE BREAKFAST  . lisinopril-hydrochlorothiazide (ZESTORETIC) 20-12.5 MG tablet TAKE 1 TABLET DAILY  . Multiple Vitamin (MULTIVITAMIN) capsule Take 1 capsule by mouth daily.   . Multiple Vitamins-Minerals (PRESERVISION AREDS 2) CAPS Take 2 capsules by mouth daily.   . Probiotic Product (ALIGN) 4 MG  CAPS Take 4 mg by mouth daily.  . RESTASIS 0.05 % ophthalmic emulsion Place 1 drop into both eyes 2 (two) times daily.   . Biotin 2500 MCG CAPS Take 7,500 mcg by mouth daily. (Patient not taking: Reported on 01/05/2020)  . psyllium (METAMUCIL) 58.6 % packet Take 1 packet by mouth daily.  (Patient not taking: Reported on 01/05/2020)  . SHINGRIX injection  (Patient not taking: Reported on 01/25/2020)   No facility-administered medications prior to visit.    Review of Systems  Constitutional: Negative.   HENT: Positive for tinnitus.   Eyes: Negative.   Respiratory: Negative.   Cardiovascular: Negative.   Gastrointestinal: Positive for diarrhea.  Endocrine: Negative.   Genitourinary: Negative.        Involuntary urination  Musculoskeletal: Negative.   Skin: Negative.   Allergic/Immunologic: Positive for food allergies.  Neurological: Negative.   Hematological: Negative.   Psychiatric/Behavioral: Negative.       Objective    BP (!) 140/56 (BP Location: Right Arm, Patient Position: Sitting, Cuff Size: Normal)   Pulse 67   Temp 97.9 F (36.6 C) (Oral)   Ht 5\' 1"  (1.549 m)   Wt 166 lb 9.6 oz (75.6 kg)   SpO2 99%   BMI 31.48 kg/m     Physical Exam Constitutional:      Appearance: Normal appearance.  HENT:     Right Ear: Tympanic membrane, ear canal and external ear normal.     Left Ear: Tympanic membrane, ear canal and external ear normal.  Cardiovascular:     Rate and Rhythm: Normal rate and regular rhythm.     Pulses: Normal pulses.     Heart sounds: Normal heart sounds.  Pulmonary:     Effort: Pulmonary effort is normal.     Breath sounds: Normal breath sounds.  Abdominal:     General: Abdomen is flat. Bowel sounds are normal.     Palpations: Abdomen is soft.  Skin:    General: Skin is warm and dry.  Neurological:     General: No focal deficit present.     Mental Status: She is alert and oriented to person, place, and time.  Psychiatric:        Mood and Affect: Mood normal.        Behavior: Behavior normal.       Last depression screening scores PHQ 2/9 Scores 01/25/2020 01/20/2019 01/13/2018  PHQ - 2 Score 0 0 0   Last fall risk screening Fall Risk  01/25/2020  Falls in the past year? 0  Number falls in past yr: 0  Injury with Fall? 0   Last Audit-C alcohol use screening Alcohol Use Disorder Test (AUDIT) 01/25/2020  1. How often do you have a drink containing alcohol? 1  2. How many drinks containing alcohol do you have on a typical day when you are drinking? 0  3. How often do you have six or more drinks on one occasion? 0  AUDIT-C Score 1  Alcohol Brief Interventions/Follow-up AUDIT Score <7 follow-up not indicated   A score of 3 or more in women, and 4 or more in men indicates increased risk for alcohol abuse, EXCEPT if all of the points are from question 1   No results found for any visits on 01/25/20.  Assessment & Plan    Routine Health Maintenance and Physical Exam  Exercise Activities and Dietary recommendations Goals    . DIET - REDUCE CALORIE INTAKE     Continue current FODMAP diet  plan to avoid certain foods to help with IBS symptoms.       Immunization History   Administered Date(s) Administered  . Fluad Quad(high Dose 65+) 11/17/2018, 01/19/2019  . Influenza, High Dose Seasonal PF 12/14/2012, 11/24/2014, 12/11/2015, 12/11/2016, 12/24/2017, 12/10/2019  . Influenza, Seasonal, Injecte, Preservative Fre 12/09/2013  . Moderna SARS-COVID-2 Vaccination 04/24/2019, 05/23/2019  . Pneumococcal Conjugate-13 10/18/2014  . Pneumococcal Polysaccharide-23 03/12/1999  . Tdap 12/26/2016  . Zoster 03/11/2005  . Zoster Recombinat (Shingrix) 04/15/2018, 04/28/2018, 11/17/2018    Health Maintenance  Topic Date Due  . DEXA SCAN  11/05/2020  . TETANUS/TDAP  12/27/2026  . INFLUENZA VACCINE  Completed  . COVID-19 Vaccine  Completed  . PNA vac Low Risk Adult  Completed    Discussed health benefits of physical activity, and encouraged her to engage in regular exercise appropriate for her age and condition.    No follow-ups on file.     ITrinna Post, PA-C, have reviewed all documentation for this visit. The documentation on 01/26/20 for the exam, diagnosis, procedures, and orders are all accurate and complete.  The entirety of the information documented in the History of Present Illness, Review of Systems and Physical Exam were personally obtained by me. Portions of this information were initially documented by [facility] and reviewed by me for thoroughness and accuracy.      Brenda Horton  [facility] [phone] (phone) [phone] (fax)  [place]

## 2020-01-25 NOTE — Patient Instructions (Signed)
Health Maintenance, Female Adopting a healthy lifestyle and getting preventive care are important in promoting health and wellness. Ask your health care provider about:  The right schedule for you to have regular tests and exams.  Things you can do on your own to prevent diseases and keep yourself healthy. What should I know about diet, weight, and exercise? Eat a healthy diet   Eat a diet that includes plenty of vegetables, fruits, low-fat dairy products, and lean protein.  Do not eat a lot of foods that are high in solid fats, added sugars, or sodium. Maintain a healthy weight Body mass index (BMI) is used to identify weight problems. It estimates body fat based on height and weight. Your health care provider can help determine your BMI and help you achieve or maintain a healthy weight. Get regular exercise Get regular exercise. This is one of the most important things you can do for your health. Most adults should:  Exercise for at least 150 minutes each week. The exercise should increase your heart rate and make you sweat (moderate-intensity exercise).  Do strengthening exercises at least twice a week. This is in addition to the moderate-intensity exercise.  Spend less time sitting. Even light physical activity can be beneficial. Watch cholesterol and blood lipids Have your blood tested for lipids and cholesterol at 84 years of age, then have this test every 5 years. Have your cholesterol levels checked more often if:  Your lipid or cholesterol levels are high.  You are older than 84 years of age.  You are at high risk for heart disease. What should I know about cancer screening? Depending on your health history and family history, you may need to have cancer screening at various ages. This may include screening for:  Breast cancer.  Cervical cancer.  Colorectal cancer.  Skin cancer.  Lung cancer. What should I know about heart disease, diabetes, and high blood  pressure? Blood pressure and heart disease  High blood pressure causes heart disease and increases the risk of stroke. This is more likely to develop in people who have high blood pressure readings, are of African descent, or are overweight.  Have your blood pressure checked: ? Every 3-5 years if you are 18-39 years of age. ? Every year if you are 40 years old or older. Diabetes Have regular diabetes screenings. This checks your fasting blood sugar level. Have the screening done:  Once every three years after age 40 if you are at a normal weight and have a low risk for diabetes.  More often and at a younger age if you are overweight or have a high risk for diabetes. What should I know about preventing infection? Hepatitis B If you have a higher risk for hepatitis B, you should be screened for this virus. Talk with your health care provider to find out if you are at risk for hepatitis B infection. Hepatitis C Testing is recommended for:  Everyone born from 1945 through 1965.  Anyone with known risk factors for hepatitis C. Sexually transmitted infections (STIs)  Get screened for STIs, including gonorrhea and chlamydia, if: ? You are sexually active and are younger than 84 years of age. ? You are older than 84 years of age and your health care provider tells you that you are at risk for this type of infection. ? Your sexual activity has changed since you were last screened, and you are at increased risk for chlamydia or gonorrhea. Ask your health care provider if   you are at risk.  Ask your health care provider about whether you are at high risk for HIV. Your health care provider may recommend a prescription medicine to help prevent HIV infection. If you choose to take medicine to prevent HIV, you should first get tested for HIV. You should then be tested every 3 months for as long as you are taking the medicine. Pregnancy  If you are about to stop having your period (premenopausal) and  you may become pregnant, seek counseling before you get pregnant.  Take 400 to 800 micrograms (mcg) of folic acid every day if you become pregnant.  Ask for birth control (contraception) if you want to prevent pregnancy. Osteoporosis and menopause Osteoporosis is a disease in which the bones lose minerals and strength with aging. This can result in bone fractures. If you are 65 years old or older, or if you are at risk for osteoporosis and fractures, ask your health care provider if you should:  Be screened for bone loss.  Take a calcium or vitamin D supplement to lower your risk of fractures.  Be given hormone replacement therapy (HRT) to treat symptoms of menopause. Follow these instructions at home: Lifestyle  Do not use any products that contain nicotine or tobacco, such as cigarettes, e-cigarettes, and chewing tobacco. If you need help quitting, ask your health care provider.  Do not use street drugs.  Do not share needles.  Ask your health care provider for help if you need support or information about quitting drugs. Alcohol use  Do not drink alcohol if: ? Your health care provider tells you not to drink. ? You are pregnant, may be pregnant, or are planning to become pregnant.  If you drink alcohol: ? Limit how much you use to 0-1 drink a day. ? Limit intake if you are breastfeeding.  Be aware of how much alcohol is in your drink. In the [place] one drink equals one 12 oz bottle of beer (355 mL), one 5 oz glass of wine (148 mL), or one 1 oz glass of hard liquor (44 mL). General instructions  Schedule regular health, dental, and eye exams.  Stay current with your vaccines.  Tell your health care provider if: ? You often feel depressed. ? You have ever been abused or do not feel safe at home. Summary  Adopting a healthy lifestyle and getting preventive care are important in promoting health and wellness.  Follow your health care provider's instructions about healthy  diet, exercising, and getting tested or screened for diseases.  Follow your health care provider's instructions on monitoring your cholesterol and blood pressure. This information is not intended to replace advice given to you by your health care provider. Make sure you discuss any questions you have with your health care provider. Document Revised: 02/18/2018 Document Reviewed: 02/18/2018 Elsevier Patient Education  2020 Elsevier Inc.  

## 2020-01-25 NOTE — Patient Instructions (Signed)
Ms. Brenda Horton , Thank you for taking time to come for your Medicare Wellness Visit. I appreciate your ongoing commitment to your health goals. Please review the following plan we discussed and let me know if I can assist you in the future.   Screening recommendations/referrals: Colonoscopy: No longer required.  Mammogram: No longer required.  Bone Density: Up to date, due 10/2020 Recommended yearly ophthalmology/optometry visit for glaucoma screening and checkup Recommended yearly dental visit for hygiene and checkup  Vaccinations: Influenza vaccine: Done 12/10/19 Pneumococcal vaccine: Completed series Tdap vaccine: Up to date, due 12/2026 Shingles vaccine: Completed series    Advanced directives: Please bring a copy of your POA (Power of Attorney) and/or Living Will to your next appointment.   Conditions/risks identified: Continue current FODMAP diet plan to avoid certain foods to help with IBS symptoms.  Next appointment: 11:00 AM today with [facility]le 86 Years and Older, Female Preventive care refers to lifestyle choices and visits with your health care provider that can promote health and wellness. What does preventive care include?  A yearly physical exam. This is also called an annual well check.  Dental exams once or twice a year.  Routine eye exams. Ask your health care provider how often you should have your eyes checked.  Personal lifestyle choices, including:  Daily care of your teeth and gums.  Regular physical activity.  Eating a healthy diet.  Avoiding tobacco and drug use.  Limiting alcohol use.  Practicing safe sex.  Taking low-dose aspirin every day.  Taking vitamin and mineral supplements as recommended by your health care provider. What happens during an annual well check? The services and screenings done by your health care provider during your annual well check will depend on your age, overall health, lifestyle risk factors, and  family history of disease. Counseling  Your health care provider may ask you questions about your:  Alcohol use.  Tobacco use.  Drug use.  Emotional well-being.  Home and relationship well-being.  Sexual activity.  Eating habits.  History of falls.  Memory and ability to understand (cognition).  Work and work Statistician.  Reproductive health. Screening  You may have the following tests or measurements:  Height, weight, and BMI.  Blood pressure.  Lipid and cholesterol levels. These may be checked every 5 years, or more frequently if you are over 59 years old.  Skin check.  Lung cancer screening. You may have this screening every year starting at age 84 if you have a 30-pack-year history of smoking and currently smoke or have quit within the past 15 years.  Fecal occult blood test (FOBT) of the stool. You may have this test every year starting at age 84.  Flexible sigmoidoscopy or colonoscopy. You may have a sigmoidoscopy every 5 years or a colonoscopy every 10 years starting at age 84.  Hepatitis C blood test.  Hepatitis B blood test.  Sexually transmitted disease (STD) testing.  Diabetes screening. This is done by checking your blood sugar (glucose) after you have not eaten for a while (fasting). You may have this done every 1-3 years.  Bone density scan. This is done to screen for osteoporosis. You may have this done starting at age 30.  Mammogram. This may be done every 1-2 years. Talk to your health care provider about how often you should have regular mammograms. Talk with your health care provider about your test results, treatment options, and if necessary, the need for more tests. Vaccines  Your  health care provider may recommend certain vaccines, such as:  Influenza vaccine. This is recommended every year.  Tetanus, diphtheria, and acellular pertussis (Tdap, Td) vaccine. You may need a Td booster every 10 years.  Zoster vaccine. You may need this  after age 26.  Pneumococcal 13-valent conjugate (PCV13) vaccine. One dose is recommended after age 19.  Pneumococcal polysaccharide (PPSV23) vaccine. One dose is recommended after age 66. Talk to your health care provider about which screenings and vaccines you need and how often you need them. This information is not intended to replace advice given to you by your health care provider. Make sure you discuss any questions you have with your health care provider. Document Released: 03/24/2015 Document Revised: 11/15/2015 Document Reviewed: 12/27/2014 Elsevier Interactive Patient Education  2017 [place] Prevention in the Home Falls can cause injuries. They can happen to people of all ages. There are many things you can do to make your home safe and to help prevent falls. What can I do on the outside of my home?  Regularly fix the edges of walkways and driveways and fix any cracks.  Remove anything that might make you trip as you walk through a door, such as a raised step or threshold.  Trim any bushes or trees on the path to your home.  Use bright outdoor lighting.  Clear any walking paths of anything that might make someone trip, such as rocks or tools.  Regularly check to see if handrails are loose or broken. Make sure that both sides of any steps have handrails.  Any raised decks and porches should have guardrails on the edges.  Have any leaves, snow, or ice cleared regularly.  Use sand or salt on walking paths during winter.  Clean up any spills in your garage right away. This includes oil or grease spills. What can I do in the bathroom?  Use night lights.  Install grab bars by the toilet and in the tub and shower. Do not use towel bars as grab bars.  Use non-skid mats or decals in the tub or shower.  If you need to sit down in the shower, use a plastic, non-slip stool.  Keep the floor dry. Clean up any water that spills on the floor as soon as it  happens.  Remove soap buildup in the tub or shower regularly.  Attach bath mats securely with double-sided non-slip rug tape.  Do not have throw rugs and other things on the floor that can make you trip. What can I do in the bedroom?  Use night lights.  Make sure that you have a light by your bed that is easy to reach.  Do not use any sheets or blankets that are too big for your bed. They should not hang down onto the floor.  Have a firm chair that has side arms. You can use this for support while you get dressed.  Do not have throw rugs and other things on the floor that can make you trip. What can I do in the kitchen?  Clean up any spills right away.  Avoid walking on wet floors.  Keep items that you use a lot in easy-to-reach places.  If you need to reach something above you, use a strong step stool that has a grab bar.  Keep electrical cords out of the way.  Do not use floor polish or wax that makes floors slippery. If you must use wax, use non-skid floor wax.  Do not  have throw rugs and other things on the floor that can make you trip. What can I do with my stairs?  Do not leave any items on the stairs.  Make sure that there are handrails on both sides of the stairs and use them. Fix handrails that are broken or loose. Make sure that handrails are as long as the stairways.  Check any carpeting to make sure that it is firmly attached to the stairs. Fix any carpet that is loose or worn.  Avoid having throw rugs at the top or bottom of the stairs. If you do have throw rugs, attach them to the floor with carpet tape.  Make sure that you have a light switch at the top of the stairs and the bottom of the stairs. If you do not have them, ask someone to add them for you. What else can I do to help prevent falls?  Wear shoes that:  Do not have high heels.  Have rubber bottoms.  Are comfortable and fit you well.  Are closed at the toe. Do not wear sandals.  If you  use a stepladder:  Make sure that it is fully opened. Do not climb a closed stepladder.  Make sure that both sides of the stepladder are locked into place.  Ask someone to hold it for you, if possible.  Clearly mark and make sure that you can see:  Any grab bars or handrails.  First and last steps.  Where the edge of each step is.  Use tools that help you move around (mobility aids) if they are needed. These include:  Canes.  Walkers.  Scooters.  Crutches.  Turn on the lights when you go into a dark area. Replace any light bulbs as soon as they burn out.  Set up your furniture so you have a clear path. Avoid moving your furniture around.  If any of your floors are uneven, fix them.  If there are any pets around you, be aware of where they are.  Review your medicines with your doctor. Some medicines can make you feel dizzy. This can increase your chance of falling. Ask your doctor what other things that you can do to help prevent falls. This information is not intended to replace advice given to you by your health care provider. Make sure you discuss any questions you have with your health care provider. Document Released: 12/22/2008 Document Revised: 08/03/2015 Document Reviewed: 04/01/2014 Elsevier Interactive Patient Education  2017 [employer].

## 2020-01-26 ENCOUNTER — Other Ambulatory Visit: Payer: Self-pay

## 2020-01-26 ENCOUNTER — Ambulatory Visit: Payer: Medicare HMO | Admitting: Gastroenterology

## 2020-01-26 ENCOUNTER — Encounter: Payer: Self-pay | Admitting: Gastroenterology

## 2020-01-26 VITALS — BP 159/72 | HR 65 | Temp 98.1°F | Ht 61.0 in | Wt 165.8 lb

## 2020-01-26 DIAGNOSIS — R197 Diarrhea, unspecified: Secondary | ICD-10-CM

## 2020-01-26 NOTE — Patient Instructions (Addendum)
Please start taking Metamucil daily once a day.  We will refer you to [facility] for pelvic floor therapy. They will contact you. If you don't hear from them in 1-2 weeks, please call them at [phone].  Psyllium granules or powder for solution What is this medicine? PSYLLIUM (SIL i yum) is a bulk-forming fiber laxative. This medicine is used to treat constipation. Increasing fiber in the diet may also help lower cholesterol and promote heart health for some people. This medicine may be used for other purposes; ask your health care provider or pharmacist if you have questions. COMMON BRAND NAME(S): Fiber Therapy, GenFiber, Geri-Mucil, Hydrocil, Konsyl, Metamucil, Metamucil MultiHealth, Mucilin, Natural Fiber Therapy, Reguloid What should I tell my health care provider before I take this medicine? They need to know if you have any of these conditions:  blockage in your bowel  difficulty swallowing  inflammatory bowel disease  phenylketonuria  stomach or intestine problems  sudden change in bowel habits lasting more than 2 weeks  an unusual or allergic reaction to psyllium, other medicines, dyes, or preservatives  pregnant or trying or get pregnant  breast-feeding How should I use this medicine? Mix this medicine into a full glass (240 mL) of water or other cool drink. Take this medicine by mouth. Follow the directions on the package labeling, or take as directed by your health care professional. Take your medicine at regular intervals. Do not take your medicine more often than directed. Talk to your pediatrician regarding the use of this medicine in children. While this drug may be prescribed for children as young as 20 years old for selected conditions, precautions do apply. Overdosage: If you think you have taken too much of this medicine contact a poison control center or emergency room at once. NOTE: This medicine is only for you. Do not share this medicine with others. What if  I miss a dose? If you miss a dose, take it as soon as you can. If it is almost time for your next dose, take only that dose. Do not take double or extra doses. What may interact with this medicine? Interactions are not expected. Take this product at least 2 hours before or after other medicines. This list may not describe all possible interactions. Give your health care provider a list of all the medicines, herbs, non-prescription drugs, or dietary supplements you use. Also tell them if you smoke, drink alcohol, or use illegal drugs. Some items may interact with your medicine. What should I watch for while using this medicine? Check with your doctor or health care professional if your symptoms do not start to get better or if they get worse. Stop using this medicine and contact your doctor or health care professional if you have rectal bleeding or if you have to treat your constipation for more than 1 week. These could be signs of a more serious condition. Drink several glasses of water a day while you are taking this medicine. This will help to relieve constipation and prevent dehydration. What side effects may I notice from receiving this medicine? Side effects that you should report to your doctor or health care professional as soon as possible:  allergic reactions like skin rash, itching or hives, swelling of the face, lips, or tongue  breathing problems  chest pain  nausea, vomiting  rectal bleeding  trouble swallowing Side effects that usually do not require medical attention (report to your doctor or health care professional if they continue or are bothersome):  bloating  gas  stomach cramps This list may not describe all possible side effects. Call your doctor for medical advice about side effects. You may report side effects to FDA at 1-800-FDA-1088. Where should I keep my medicine? Keep out of the reach of children. Store at room temperature between 15 and 30 degrees C (59  and 86 degrees F). Protect from moisture. Throw away any unused medicine after the expiration date. NOTE: This sheet is a summary. It may not cover all possible information. If you have questions about this medicine, talk to your doctor, pharmacist, or health care provider.  2020 Elsevier/Gold Standard (2017-07-22 15:41:08)

## 2020-01-27 NOTE — Progress Notes (Signed)
Brenda Horton [address]  [place]  [place], [place] [address]  Main: [phone]  Fax: [phone]   Gastroenterology Consultation  Referring Provider:     Paulene Floor Primary Care Physician:  Paulene Floor Reason for Consultation:     Diarrhea        HPI:    Chief Complaint  Patient presents with  . Chronic diarrhea    Patient stated that she has had diarrhea for 3 weeks but changed her diet and doing better.    Brenda Horton is a 86 y.o. y/o female referred for consultation & management  by Dr. Terrilee Croak, Wendee Beavers, PA-C.  Patient reports that after she started eating differently recently she started noticing diarrhea.  Since she has been avoiding lactose, and wheat products her symptoms are better.  However, does have some fecal incontinence issues also.  Usually is only having 1 bowel movement a day that is somewhat loose.  Has had 5 vaginal deliveries in the past.  Also has chronic urinary incontinence.  No blood in stool.  No weight loss.  No abdominal pain, nausea or vomiting.  I personally reviewed her previous records.  She was evaluated by GI at [facility] in 2017 and at that time was evaluated for diarrhea as well.  She was given colestipol at that time with improvement.  As per previous notes "Pt's stool studies dated 07/27/15 were negative for bacteria. Pt's labs dated 09/07/15 showed a normal CBC, normal liver enzymes, normal amylase and lipase. Pt's celiac panel dated 06/17/11 was negative. Pt's CT abdomen pelvis with contrast showed no abnormalities seen to explain diarrhea. Negative for bowel distention. Normal appendix. Few diverticula without CT evidence of acute diverticulitis. Ectasia of the common bile duct which is often seen after cholecystectomy. Cyst of the left hepatic lobe.  Pt is s/p screening colonoscopy with Dr. Helene Kelp on 06/19/15 for personal history of colon polyps. Exam was complete to cecum and quality of prep was good.  Exam showed no recurrence of polyps, minimal diverticulosis of sigmoid colon and internal hemorrhoids. Pt not recommended for any more surveillance exam."  The plan from that note was to continue colestipol due to bile acid enteropathy status post cholecystectomy, with possible underlying IBS.  It does state that microscopic colitis was not ruled out on her April 2017 colonoscopy.  Past Medical History:  Diagnosis Date  . CKD (chronic kidney disease) stage 3, GFR 30-59 ml/min (HCC) 03/09/2018  . Hypertension   . Macular degeneration   . Thyroid disease     Past Surgical History:  Procedure Laterality Date  . ABDOMINAL HYSTERECTOMY     Partial  . EYE SURGERY    . FRACTURE SURGERY      Prior to Admission medications   Medication Sig Start Date End Date Taking? Authorizing Provider  Coenzyme Q10 10 MG capsule Take 10 mg by mouth daily.    Yes [provider]  levothyroxine (SYNTHROID) 112 MCG tablet TAKE 1 TABLET DAILY BEFORE BREAKFAST 08/10/19  Yes Carles Collet M, PA-C  lisinopril-hydrochlorothiazide (ZESTORETIC) 20-12.5 MG tablet TAKE 1 TABLET DAILY 01/18/20  Yes Carles Collet M, PA-C  Multiple Vitamin (MULTIVITAMIN) capsule Take 1 capsule by mouth daily.    Yes [provider]  Multiple Vitamins-Minerals (PRESERVISION AREDS 2) CAPS Take 2 capsules by mouth daily.    Yes [provider]  Probiotic Product (ALIGN) 4 MG CAPS Take 4 mg by mouth daily.   Yes [provider]  RESTASIS 0.05 % ophthalmic emulsion Place 1 drop into both eyes 2 (two) times daily.  04/27/19  Yes [provider]  psyllium (METAMUCIL) 58.6 % packet Take 1 packet by mouth daily.  Patient not taking: Reported on 01/05/2020    [provider]    Family History  Problem Relation Age of Onset  . Hyperthyroidism Mother   . Hypertension Father   . Hyperthyroidism Daughter   . Arthritis Daughter   . Rheum arthritis Daughter   . Hyperthyroidism Son       Social History   Tobacco Use  . Smoking status: Never Smoker  . Smokeless tobacco: Never Used  Vaping Use  . Vaping Use: Never used  Substance Use Topics  . Alcohol use: Yes    Comment: maybe 2 glasses of wine a month  . Drug use: Never    Allergies as of 01/26/2020  . (No Known Allergies)    Review of Systems:    All systems reviewed and negative except where noted in HPI.   Physical Exam:  BP (!) 159/72   Pulse 65   Temp 98.1 F (36.7 C) (Oral)   Ht 5\' 1"  (1.549 m)   Wt 165 lb 12.8 oz (75.2 kg)   BMI 31.33 kg/m  No LMP recorded. Patient is postmenopausal. Psych:  Alert and cooperative. Normal mood and affect. General:   Alert,  Well-developed, well-nourished, pleasant and cooperative in NAD Head:  Normocephalic and atraumatic. Eyes:  Sclera clear, no icterus.   Conjunctiva pink. Ears:  Normal auditory acuity. Nose:  No deformity, discharge, or lesions. Mouth:  No deformity or lesions,oropharynx pink & moist. Neck:  Supple; no masses or thyromegaly. Abdomen:  Normal bowel sounds.  No bruits.  Soft, non-tender and non-distended without masses, hepatosplenomegaly or hernias noted.  No guarding or rebound tenderness.    Msk:  Symmetrical without gross deformities. Good, equal movement & strength bilaterally. Pulses:  Normal pulses noted. Extremities:  No clubbing or edema.  No cyanosis. Neurologic:  Alert and oriented x3;  grossly normal neurologically. Skin:  Intact without significant lesions or rashes. No jaundice. Lymph Nodes:  No significant cervical adenopathy. Psych:  Alert and cooperative. Normal mood and affect.   Labs: CBC    Component Value Date/Time   WBC 5.6 01/05/2020 1015   RBC 3.79 01/05/2020 1015   HGB 13.0 01/05/2020 1015   HCT 40.0 01/05/2020 1015   PLT 214 01/05/2020 1015   MCV 106 (H) 01/05/2020 1015   MCH 34.3 (H) 01/05/2020 1015   MCHC 32.5 01/05/2020 1015   RDW 13.4 01/05/2020 1015   LYMPHSABS 1.6 01/05/2020 1015   EOSABS 0.3  01/05/2020 1015   BASOSABS 0.1 01/05/2020 1015   CMP     Component Value Date/Time   NA 140 01/05/2020 1015   K 3.9 01/05/2020 1015   CL 101 01/05/2020 1015   CO2 21 01/05/2020 1015   GLUCOSE 99 01/05/2020 1015   BUN 27 01/05/2020 1015   CREATININE 1.11 (H) 01/05/2020 1015   CALCIUM 9.5 01/05/2020 1015   PROT 6.6 01/05/2020 1015   ALBUMIN 4.4 01/05/2020 1015   AST 17 01/05/2020 1015   ALT 15 01/05/2020 1015   ALKPHOS 31 (L) 01/05/2020 1015   BILITOT 0.4 01/05/2020 1015   GFRNONAA 44 (L) 01/05/2020 1015   GFRAA 51 (L) 01/05/2020 1015    Imaging Studies: No results found.  Assessment and Plan:   Gessica Jawad is a 86 y.o. y/o female has been  referred for diarrhea  Patient is not having multiple bowel movements today, but rather just 1 bowel movement a day and that is associated with some fecal incontinence next  Symptoms most consistent with pelvic floor dysfunction, given previous history of multiple vaginal deliveries and associated urinary incontinence as well  Pelvic floor physical therapy discussed and patient agreeable to referral.  Referral will be placed.  No alarm symptoms present to indicate repeat colonoscopy.  Microscopic colitis would be expected to have multiple loose bowel movements a day and this is not the case here.  Would recommend Metamucil to help bulk stool and if no improvement in symptoms, we can prescribe colestipol as this may have helped her before given her above notes from 2017 (notes personally reviewed)  Recent infectious work-up was also negative  If symptoms do not improve, can consider further work-up as necessary  Dr Brenda Horton  Speech recognition software was used to dictate the above note.

## 2020-01-28 ENCOUNTER — Telehealth: Payer: Self-pay

## 2020-01-28 NOTE — Telephone Encounter (Signed)
Patient called wanting to know if you wanted her to continue or stop taking her Align (pro-biotic) since she had been taking it for the past 8 years. Please advise.

## 2020-02-01 NOTE — Telephone Encounter (Signed)
Called patient back to let her know what Dr. Bonna Gains recommended (please read below) and patient agreed. Patient had no further questions.

## 2020-02-10 ENCOUNTER — Other Ambulatory Visit: Payer: Self-pay

## 2020-02-10 ENCOUNTER — Encounter: Payer: Self-pay | Admitting: Podiatry

## 2020-02-10 ENCOUNTER — Ambulatory Visit: Payer: Medicare HMO | Admitting: Podiatry

## 2020-02-10 DIAGNOSIS — B351 Tinea unguium: Secondary | ICD-10-CM

## 2020-02-10 DIAGNOSIS — M79675 Pain in left toe(s): Secondary | ICD-10-CM

## 2020-02-10 DIAGNOSIS — N183 Chronic kidney disease, stage 3 unspecified: Secondary | ICD-10-CM

## 2020-02-10 DIAGNOSIS — M79674 Pain in right toe(s): Secondary | ICD-10-CM

## 2020-02-10 DIAGNOSIS — M2011 Hallux valgus (acquired), right foot: Secondary | ICD-10-CM

## 2020-02-10 DIAGNOSIS — L608 Other nail disorders: Secondary | ICD-10-CM

## 2020-02-10 NOTE — Progress Notes (Signed)
This patient returns to the office for evaluation and treatment of long thick painful nails .  This patient is unable to trim his own nails  on her big and second toenails  left foot.since the patient cannot reach his feet.  Patient says the nails are painful walking and wearing his shoes.  He returns for preventive foot care services.  General Appearance  Alert, conversant and in no acute stress.  Vascular  Dorsalis pedis and posterior tibial  pulses are palpable  bilaterally.  Capillary return is within normal limits  bilaterally. Temperature is within normal limits  bilaterally.  Neurologic  Senn-Weinstein monofilament wire test within normal limits  bilaterally. Muscle power within normal limits bilaterally.  Nails Thick disfigured discolored nails with subungual debris  First and second toenails left foot.. No evidence of bacterial infection or drainage bilaterally.  Orthopedic  No limitations of motion  feet .  No crepitus or effusions noted.  No bony pathology or digital deformities noted.  Skin  normotropic skin with no porokeratosis noted bilaterally.  No signs of infections or ulcers noted.     Onychomycosis    Pain in toes left foot  Debridement  of nails  1-5  B/L with a nail nipper.  Nails were then filed using a dremel tool with no incidents.    RTC 3 months    Gardiner Barefoot DPM

## 2020-02-18 ENCOUNTER — Telehealth: Payer: Self-pay | Admitting: Physician Assistant

## 2020-02-18 NOTE — Telephone Encounter (Signed)
Pt found a nodule on her left cheek inside next to the rectal opening and she doesn't know if she needs to see Fabio Bering or go to a dermatologist/ please advise asap

## 2020-02-21 NOTE — Telephone Encounter (Signed)
Patient was advised and states she will call the office back if she needs appointment. She reports she has being soaking in warm water w/ salt. Just a [place]

## 2020-02-21 NOTE — Telephone Encounter (Signed)
I think we'll be able to take care of it. She can come see [place] for a visit. If she felt more comfortable with a dermatologist, she is more than welcome to schedule with the though they are generally not able to accommodate people quickly.

## 2020-03-08 ENCOUNTER — Telehealth (INDEPENDENT_AMBULATORY_CARE_PROVIDER_SITE_OTHER): Payer: Medicare HMO | Admitting: Gastroenterology

## 2020-03-08 DIAGNOSIS — R159 Full incontinence of feces: Secondary | ICD-10-CM | POA: Diagnosis not present

## 2020-03-08 MED ORDER — COLESTIPOL HCL 1 G PO TABS: 2.0000 g | ORAL_TABLET | Freq: Every day | ORAL | 1 refills | Status: DC

## 2020-03-08 NOTE — Progress Notes (Signed)
Brenda BouillonVarnita Ahniyah Giancola, MD [address]  [address]  [place], [place] [address]  Main: [phone]  Fax: [phone]   Primary Care Physician: Brenda Horton, Brenda M, PA-C  Virtual Visit via Telephone Note  I connected with patient on 03/08/20 at  1:15 PM EST by telephone and verified that I am speaking with the correct person using two identifiers.   I discussed the limitations, risks, security and privacy concerns of performing an evaluation and management service by telephone and the availability of in person appointments. I also discussed with the patient that there may be a patient responsible charge related to this service. The patient expressed understanding and agreed to proceed.  Location of Patient: Home Location of Provider: Home Persons involved: Patient and provider only during the visit (nursing staff and front desk staff was involved in communicating with the patient prior to the appointment, reviewing medications and checking them in)   History of Present Illness: Chief Complaint  Patient presents with  . Follow-up    Diarrhea      HPI: Brenda Horton is a 86 y.o. female here for follow-up of fecal incontinence and diarrhea.  Her symptoms on last visit were thought to be more due to fecal incontinence than true diarrhea.  Pelvic floor physical therapy was recommended, however patient has not been able to go as she has back problems and is seeing a chiropractor and does not have time for pelvic floor physical therapy.  However, she has been doing Kegel's exercises at home's and fecal incontinence is better.  Did try Metamucil with partial results.  Does have days with loose bowel movements especially if she eats a lot of vegetables.  Has not had any bowel movements today.  No blood in stool.  No nausea or vomiting.  No weight loss.  No abdominal pain.  Previous history: I personally reviewed her previous records.  She was evaluated by GI at [facility] in 2017 and at  that time was evaluated for diarrhea as well.  She was given colestipol at that time with improvement.  As per previous notes "Pt's stool studies dated 07/27/15 were negative for bacteria. Pt's labs dated 09/07/15 showed a normal CBC, normal liver enzymes, normal amylase and lipase. Pt's celiac panel dated 06/17/11 was negative. Pt's CT abdomen pelvis with contrast showed no abnormalities seen to explain diarrhea. Negative for bowel distention. Normal appendix. Few diverticula without CT evidence of acute diverticulitis. Ectasia of the common bile duct which is often seen after cholecystectomy. Cyst of the left hepatic lobe.  Pt is s/p screening colonoscopy with Dr. Leonor LivHolt on 06/19/15 for personal history of colon polyps. Exam was complete to cecum and quality of prep was good. Exam showed no recurrence of polyps, minimal diverticulosis of sigmoid colon and internal hemorrhoids. Pt not recommended for any more surveillance exam."  The plan from that note was to continue colestipol due to bile acid enteropathy status post cholecystectomy, with possible underlying IBS.  It does state that microscopic colitis was not ruled out on her April 2017 colonoscopy.  Current Outpatient Medications  Medication Sig Dispense Refill  . Coenzyme Q10 10 MG capsule Take 10 mg by mouth daily.     Marland Kitchen. levothyroxine (SYNTHROID) 112 MCG tablet TAKE 1 TABLET DAILY BEFORE BREAKFAST 90 tablet 3  . lisinopril-hydrochlorothiazide (ZESTORETIC) 20-12.5 MG tablet TAKE 1 TABLET DAILY 90 tablet 0  . Multiple Vitamin (MULTIVITAMIN) capsule Take 1 capsule by mouth daily.     . Multiple Vitamins-Minerals (PRESERVISION  AREDS 2) CAPS Take 2 capsules by mouth daily.     . psyllium (METAMUCIL) 58.6 % packet Take 1 packet by mouth daily.     . RESTASIS 0.05 % ophthalmic emulsion Place 1 drop into both eyes 2 (two) times daily.     . Probiotic Product (ALIGN) 4 MG CAPS Take 4 mg by mouth daily. (Patient not taking: Reported on 03/08/2020)     No  current facility-administered medications for this visit.    Allergies as of 03/08/2020  . (No Known Allergies)    Review of Systems:    All systems reviewed and negative except where noted in HPI.   Observations/Objective:  Labs: CMP     Component Value Date/Time   NA 140 01/05/2020 1015   K 3.9 01/05/2020 1015   CL 101 01/05/2020 1015   CO2 21 01/05/2020 1015   GLUCOSE 99 01/05/2020 1015   BUN 27 01/05/2020 1015   CREATININE 1.11 (H) 01/05/2020 1015   CALCIUM 9.5 01/05/2020 1015   PROT 6.6 01/05/2020 1015   ALBUMIN 4.4 01/05/2020 1015   AST 17 01/05/2020 1015   ALT 15 01/05/2020 1015   ALKPHOS 31 (L) 01/05/2020 1015   BILITOT 0.4 01/05/2020 1015   GFRNONAA 44 (L) 01/05/2020 1015   GFRAA 51 (L) 01/05/2020 1015   Lab Results  Component Value Date   WBC 5.6 01/05/2020   HGB 13.0 01/05/2020   HCT 40.0 01/05/2020   MCV 106 (H) 01/05/2020   PLT 214 01/05/2020    Imaging Studies: No results found.  Assessment and Plan:   Brenda Horton is a 86 y.o. y/o female with fecal incontinence and diarrhea  Assessment and Plan: Patient's symptoms are due to pelvic floor dysfunction.  Pelvic floor physical therapy recommended again  However, since she is unable to make it to the pelvic floor physical therapy referral at this time, will start colestipol given her previous history of cholecystectomy and since Metamucil only led to partial results  If symptoms do not improve, can consider changing dosage in the future  No alarm symptoms present to indicate colonoscopy at this time  Follow Up Instructions:    I discussed the assessment and treatment plan with the patient. The patient was provided an opportunity to ask questions and all were answered. The patient agreed with the plan and demonstrated an understanding of the instructions.   The patient was advised to call back or seek an in-person evaluation if the symptoms worsen or if the condition fails to improve as  anticipated.  I provided 12 minutes of non-face-to-face time during this encounter. Additional time was spent in reviewing patient's chart, placing orders etc.   Pasty Spillers, MD  Speech recognition software was used to dictate this note.

## 2020-03-23 ENCOUNTER — Telehealth: Payer: Self-pay | Admitting: Gastroenterology

## 2020-03-23 NOTE — Telephone Encounter (Signed)
Patient called- still having loose bowel, wants to know what else she can do. Please advise

## 2020-03-30 MED ORDER — COLESTIPOL HCL 1 G PO TABS: 4.0000 g | ORAL_TABLET | Freq: Every day | ORAL | 0 refills | Status: DC

## 2020-03-30 NOTE — Telephone Encounter (Signed)
Virgel Manifold, MD  You 19 hours ago (3:17 PM)    Increase Colestid to 4 g daily. Send new prescription, 90-day supply    Called patient to let her know that she was to increase her Colestid to 4 g daily with a 90 day supply.

## 2020-04-03 ENCOUNTER — Telehealth: Payer: Self-pay | Admitting: Physician Assistant

## 2020-04-03 DIAGNOSIS — I10 Essential (primary) hypertension: Secondary | ICD-10-CM

## 2020-04-03 MED ORDER — LISINOPRIL-HYDROCHLOROTHIAZIDE 20-12.5 MG PO TABS: 1.0000 | ORAL_TABLET | Freq: Every day | ORAL | 0 refills | Status: DC

## 2020-04-03 NOTE — Telephone Encounter (Signed)
L.O.V. was on 01/25/2020 and next appointment is on 01/25/2021. Medication send into the pharmacy.

## 2020-04-03 NOTE — Telephone Encounter (Signed)
[facility] faxed refill request for the following medications:  lisinopril-hydrochlorothiazide (ZESTORETIC) 20-12.5 MG tablet  Last Rx: 01/18/2020 90 day supply no refills LOV: 01/25/2020 Please advise. Thanks TNP

## 2020-04-27 ENCOUNTER — Other Ambulatory Visit: Payer: Self-pay

## 2020-04-27 ENCOUNTER — Ambulatory Visit: Payer: Medicare HMO | Admitting: Podiatry

## 2020-04-27 ENCOUNTER — Encounter: Payer: Self-pay | Admitting: Podiatry

## 2020-04-27 DIAGNOSIS — M216X2 Other acquired deformities of left foot: Secondary | ICD-10-CM | POA: Diagnosis not present

## 2020-04-27 DIAGNOSIS — L84 Corns and callosities: Secondary | ICD-10-CM | POA: Insufficient documentation

## 2020-04-27 NOTE — Progress Notes (Signed)
This patient returns to the office for evaluation and treatment of painful callus on left forefoot.  She says she has started walking which has led to callus formation.  She says these callus are painful walking and wearing her shoes.  She presents to the office for treatment of these callus.  General Appearance  Alert, conversant and in no acute stress.  Vascular  Dorsalis pedis and posterior tibial  pulses are palpable  bilaterally.  Capillary return is within normal limits  bilaterally. Temperature is within normal limits  bilaterally.  Neurologic  Senn-Weinstein monofilament wire test within normal limits  bilaterally. Muscle power within normal limits bilaterally.  Nails Thick disfigured discolored nails with subungual debris  First and second toenails left foot.. No evidence of bacterial infection or drainage bilaterally.  Orthopedic  No limitations of motion  feet .  No crepitus or effusions noted.  No bony pathology or digital deformities noted. Prominent metatarsal heads left foot.  Skin  normotropic skin noted bilaterally.  No signs of infections or ulcers noted.   Callus noted sub 2,4  Left foot.  Porokeratosis left forefoot.  Debridement  of callus with # 15 blade..  RTC 6 weeks.   Gardiner Barefoot DPM

## 2020-05-03 ENCOUNTER — Encounter: Payer: Self-pay | Admitting: Gastroenterology

## 2020-05-03 ENCOUNTER — Ambulatory Visit: Payer: Medicare HMO | Admitting: Gastroenterology

## 2020-05-03 ENCOUNTER — Other Ambulatory Visit: Payer: Self-pay

## 2020-05-03 VITALS — BP 150/72 | HR 67 | Temp 98.3°F | Ht 61.0 in | Wt 162.4 lb

## 2020-05-03 DIAGNOSIS — R159 Full incontinence of feces: Secondary | ICD-10-CM | POA: Diagnosis not present

## 2020-05-04 NOTE — Progress Notes (Signed)
Brenda Antigua, MD [address]  [place]  [place], [place] [address]  Main: [phone]  Fax: [phone]   Primary Care Physician: Trinna Post, PA-C   Chief Complaint  Patient presents with  . Diarrhea    HPI: Brenda Horton is a 87 y.o. female here for follow-up of fecal incontinence.  Since starting colestipol she states her fecal incontinence episodes have significantly improved.  She has only had one episode of incontinence that was yesterday, no other since then, compared to frequent episodes before that.  She is taking 4 g colestipol every morning.  She has continued to take Metamucil and is taking 10 pills about a day. The patient denies abdominal or flank pain, anorexia, nausea or vomiting, dysphagia, change in bowel habits or black or bloody stools or weight loss.  Previous history: I personally reviewed her previous records.  She was evaluated by GI at [facility] in 2017 and at that time was evaluated for diarrhea as well.  She was given colestipol at that time with improvement.  As per previous notes "Pt's stool studies dated 07/27/15 were negative for bacteria. Pt's labs dated 09/07/15 showed a normal CBC, normal liver enzymes, normal amylase and lipase. Pt's celiac panel dated 06/17/11 was negative. Pt's CT abdomen pelvis with contrast showed no abnormalities seen to explain diarrhea. Negative for bowel distention. Normal appendix. Few diverticula without CT evidence of acute diverticulitis. Ectasia of the common bile duct which is often seen after cholecystectomy. Cyst of the left hepatic lobe.  Pt is s/p screening colonoscopy with Dr. Helene Kelp on 06/19/15 for personal history of colon polyps. Exam was complete to cecum and quality of prep was good. Exam showed no recurrence of polyps, minimal diverticulosis of sigmoid colon and internal hemorrhoids. Pt not recommended for any more surveillance exam."  The plan from that note was to continue colestipol due to  bile acid enteropathy status post cholecystectomy, with possible underlying IBS.  It does state that microscopic colitis was not ruled out on her April 2017 colonoscopy.  Current Outpatient Medications  Medication Sig Dispense Refill  . Coenzyme Q10 10 MG capsule Take 10 mg by mouth daily.     . colestipol (COLESTID) 1 g tablet Take 4 tablets (4 g total) by mouth daily. 360 tablet 0  . levothyroxine (SYNTHROID) 112 MCG tablet TAKE 1 TABLET DAILY BEFORE BREAKFAST 90 tablet 3  . lisinopril-hydrochlorothiazide (ZESTORETIC) 20-12.5 MG tablet Take 1 tablet by mouth daily. 90 tablet 0  . Multiple Vitamin (MULTIVITAMIN) capsule Take 1 capsule by mouth daily.     . Multiple Vitamins-Minerals (PRESERVISION AREDS 2) CAPS Take 2 capsules by mouth daily.     . Probiotic Product (ALIGN) 4 MG CAPS Take 4 mg by mouth daily.    . psyllium (METAMUCIL) 58.6 % packet Take 1 packet by mouth daily.     . RESTASIS 0.05 % ophthalmic emulsion Place 1 drop into both eyes 2 (two) times daily.      No current facility-administered medications for this visit.    Allergies as of 05/03/2020  . (No Known Allergies)    ROS:  General: Negative for anorexia, weight loss, fever, chills, fatigue, weakness. ENT: Negative for hoarseness, difficulty swallowing , nasal congestion. CV: Negative for chest pain, angina, palpitations, dyspnea on exertion, peripheral edema.  Respiratory: Negative for dyspnea at rest, dyspnea on exertion, cough, sputum, wheezing.  GI: See history of present illness. GU:  Negative for dysuria, hematuria, urinary incontinence, urinary frequency, nocturnal  urination.  Endo: Negative for unusual weight change.    Physical Examination:   BP (!) 150/72   Pulse 67   Temp 98.3 F (36.8 C) (Oral)   Ht 5\' 1"  (1.549 m)   Wt 162 lb 6.4 oz (73.7 kg)   BMI 30.69 kg/m   General: Well-nourished, well-developed in no acute distress.  Eyes: No icterus. Conjunctivae pink. Mouth: Oropharyngeal mucosa  moist and pink , no lesions erythema or exudate. Neck: Supple, Trachea midline Abdomen: Bowel sounds are normal, nontender, nondistended, no hepatosplenomegaly or masses, no abdominal bruits or hernia , no rebound or guarding.   Extremities: No lower extremity edema. No clubbing or deformities. Neuro: Alert and oriented x 3.  Grossly intact. Skin: Warm and dry, no jaundice.   Psych: Alert and cooperative, normal mood and affect.   Labs: CMP     Component Value Date/Time   NA 140 01/05/2020 1015   K 3.9 01/05/2020 1015   CL 101 01/05/2020 1015   CO2 21 01/05/2020 1015   GLUCOSE 99 01/05/2020 1015   BUN 27 01/05/2020 1015   CREATININE 1.11 (H) 01/05/2020 1015   CALCIUM 9.5 01/05/2020 1015   PROT 6.6 01/05/2020 1015   ALBUMIN 4.4 01/05/2020 1015   AST 17 01/05/2020 1015   ALT 15 01/05/2020 1015   ALKPHOS 31 (L) 01/05/2020 1015   BILITOT 0.4 01/05/2020 1015   GFRNONAA 44 (L) 01/05/2020 1015   GFRAA 51 (L) 01/05/2020 1015   Lab Results  Component Value Date   WBC 5.6 01/05/2020   HGB 13.0 01/05/2020   HCT 40.0 01/05/2020   MCV 106 (H) 01/05/2020   PLT 214 01/05/2020    Imaging Studies: No results found.  Assessment and Plan:   Brenda Horton is a 87 y.o. y/o female here for follow-up of fecal incontinence  Symptoms have improved since starting colestipol with only one incontinence episode since then Symptoms are thought to be due to a combination of pelvic floor dysfunction and loose stools from postcholecystectomy status years ago  Patient was recommended to see pelvic floor physical therapy but refused and is doing Kegel's exercises at home and she states this is helping  I have asked her to decrease her Metamucil as she is taking 10 pills a day, 5 in the morning, 5 in the evening.  I have advised her to decrease it to only 1 time a day and reassess symptoms and call us if symptoms worsen  No alarm symptoms present at this time  Patient advised to limit caffeine,  sugar, food beverages, candy or gum as well.  She is now only drinking 1 cup of coffee a day    Dr Brenda Horton

## 2020-05-15 ENCOUNTER — Ambulatory Visit: Payer: Medicare HMO | Admitting: Podiatry

## 2020-05-15 ENCOUNTER — Encounter: Payer: Self-pay | Admitting: Podiatry

## 2020-05-15 ENCOUNTER — Other Ambulatory Visit: Payer: Self-pay

## 2020-05-15 DIAGNOSIS — N183 Chronic kidney disease, stage 3 unspecified: Secondary | ICD-10-CM

## 2020-05-15 DIAGNOSIS — B351 Tinea unguium: Secondary | ICD-10-CM | POA: Diagnosis not present

## 2020-05-15 DIAGNOSIS — L608 Other nail disorders: Secondary | ICD-10-CM | POA: Diagnosis not present

## 2020-05-15 DIAGNOSIS — M2011 Hallux valgus (acquired), right foot: Secondary | ICD-10-CM | POA: Diagnosis not present

## 2020-05-15 DIAGNOSIS — M79674 Pain in right toe(s): Secondary | ICD-10-CM

## 2020-05-15 DIAGNOSIS — M79675 Pain in left toe(s): Secondary | ICD-10-CM

## 2020-05-15 NOTE — Progress Notes (Signed)
This patient returns to the office for evaluation and treatment of long thick painful nails .  This patient is unable to trim her own nails  on her big and second toenails  left foot.since the patient cannot reach her feet.  Patient says the nails are painful walking and wearing his shoes.  She returns for preventive foot care services.  General Appearance  Alert, conversant and in no acute stress.  Vascular  Dorsalis pedis and posterior tibial  pulses are palpable  bilaterally.  Capillary return is within normal limits  bilaterally. Temperature is within normal limits  bilaterally.  Neurologic  Senn-Weinstein monofilament wire test within normal limits  bilaterally. Muscle power within normal limits bilaterally.  Nails Thick disfigured discolored nails with subungual debris  First and second toenails left foot.. No evidence of bacterial infection or drainage bilaterally.  Orthopedic  No limitations of motion  feet .  No crepitus or effusions noted.  HAV  B/L.  Hammer toes 2-5  B/L.  Prominent metatarsal heads left forefoot.  Skin  normotropic skin with no porokeratosis noted bilaterally.  No signs of infections or ulcers noted.  Asymptomatic  Callus left forefoot.   Onychomycosis    Pain in toes left foot  Debridement  of nails  1-5  B/L with a nail nipper.  Nails were then filed using a dremel tool with no incidents.    RTC 3 months    Gardiner Barefoot DPM

## 2020-06-28 ENCOUNTER — Other Ambulatory Visit: Payer: Self-pay | Admitting: Gastroenterology

## 2020-07-11 NOTE — Telephone Encounter (Signed)
Patient says she's been on the same medication for 3 mths. The same symptoms are reappearing. Wants to know if she needs a new medication. Has had diarrhea for the last two weeks.

## 2020-07-12 ENCOUNTER — Other Ambulatory Visit: Payer: Self-pay

## 2020-07-12 ENCOUNTER — Encounter: Payer: Self-pay | Admitting: Gastroenterology

## 2020-07-12 ENCOUNTER — Ambulatory Visit: Payer: Medicare HMO | Admitting: Gastroenterology

## 2020-07-12 VITALS — BP 147/75 | HR 75 | Ht 61.0 in | Wt 157.2 lb

## 2020-07-12 DIAGNOSIS — R197 Diarrhea, unspecified: Secondary | ICD-10-CM | POA: Diagnosis not present

## 2020-07-12 MED ORDER — COLESTIPOL HCL 1 G PO TABS: 4.0000 g | ORAL_TABLET | Freq: Two times a day (BID) | ORAL | 2 refills | Status: DC

## 2020-07-12 MED ORDER — CHOLESTYRAMINE LIGHT 4 G PO PACK: 4.0000 g | PACK | Freq: Two times a day (BID) | ORAL | 2 refills | Status: DC

## 2020-07-13 NOTE — Progress Notes (Signed)
[name], MD [address]  [place]  [place], [place] [address]  Main: [phone]  Fax: [phone]   Primary Care Physician: Doreen Beam, FNP   Chief Complaint  Patient presents with  . Follow-up    diarrhea    HPI: Brenda Horton is a 87 y.o. female here for follow-up of diarrhea.  Patient ran out of colestipol about a month ago.  Episodes of fecal incontinence have resolved on colestipol, but patient was still continuing to have some loose stools.  Denies any blood in stool.  She states the diarrhea was doing better but it has started to recur and she has started to have episodes of incontinence again.  Current Outpatient Medications  Medication Sig Dispense Refill  . Coenzyme Q10 10 MG capsule Take 10 mg by mouth daily.     . colestipol (COLESTID) 1 g tablet Take 4 tablets (4 g total) by mouth 2 (two) times daily. 240 tablet 2  . levothyroxine (SYNTHROID) 112 MCG tablet TAKE 1 TABLET DAILY BEFORE BREAKFAST 90 tablet 3  . lisinopril-hydrochlorothiazide (ZESTORETIC) 20-12.5 MG tablet Take 1 tablet by mouth daily. 90 tablet 0  . Multiple Vitamin (MULTIVITAMIN) capsule Take 1 capsule by mouth daily.     . Multiple Vitamins-Minerals (PRESERVISION AREDS 2) CAPS Take 2 capsules by mouth daily.     . Probiotic Product (ALIGN) 4 MG CAPS Take 4 mg by mouth daily.    . psyllium (METAMUCIL) 58.6 % packet Take 1 packet by mouth daily.     . RESTASIS 0.05 % ophthalmic emulsion Place 1 drop into both eyes 2 (two) times daily.      No current facility-administered medications for this visit.    Allergies as of 07/12/2020  . (No Known Allergies)    ROS:  General: Negative for anorexia, weight loss, fever, chills, fatigue, weakness. ENT: Negative for hoarseness, difficulty swallowing , nasal congestion. CV: Negative for chest pain, angina, palpitations, dyspnea on exertion, peripheral edema.  Respiratory: Negative for dyspnea at rest, dyspnea on exertion,  cough, sputum, wheezing.  GI: See history of present illness. GU:  Negative for dysuria, hematuria, urinary incontinence, urinary frequency, nocturnal urination.  Endo: Negative for unusual weight change.    Physical Examination:   BP (!) 147/75 (BP Location: Left Arm)   Pulse 75   Ht 5\' 1"  (1.549 m)   Wt 157 lb 3.2 oz (71.3 kg)   BMI 29.70 kg/m   General: Well-nourished, well-developed in no acute distress.  Eyes: No icterus. Conjunctivae pink. Mouth: Oropharyngeal mucosa moist and pink , no lesions erythema or exudate. Neck: Supple, Trachea midline Abdomen: Bowel sounds are normal, nontender, nondistended, no hepatosplenomegaly or masses, no abdominal bruits or hernia , no rebound or guarding.   Extremities: No lower extremity edema. No clubbing or deformities. Neuro: Alert and oriented x 3.  Grossly intact. Skin: Warm and dry, no jaundice.   Psych: Alert and cooperative, normal mood and affect.   Labs: CMP     Component Value Date/Time   NA 140 01/05/2020 1015   K 3.9 01/05/2020 1015   CL 101 01/05/2020 1015   CO2 21 01/05/2020 1015   GLUCOSE 99 01/05/2020 1015   BUN 27 01/05/2020 1015   CREATININE 1.11 (H) 01/05/2020 1015   CALCIUM 9.5 01/05/2020 1015   PROT 6.6 01/05/2020 1015   ALBUMIN 4.4 01/05/2020 1015   AST 17 01/05/2020 1015   ALT 15 01/05/2020 1015   ALKPHOS 31 (L) 01/05/2020 1015  BILITOT 0.4 01/05/2020 1015   GFRNONAA 44 (L) 01/05/2020 1015   GFRAA 51 (L) 01/05/2020 1015   Lab Results  Component Value Date   WBC 5.6 01/05/2020   HGB 13.0 01/05/2020   HCT 40.0 01/05/2020   MCV 106 (H) 01/05/2020   PLT 214 01/05/2020    Imaging Studies: No results found.  Assessment and Plan:   Brenda Horton is a 87 y.o. y/o female here for follow-up of diarrhea  Colestipol added 4 g a day was helping the patient, she was taking it as 2 g twice a day.  She ran out of the medication about a month ago  Will increase dose to 4 g twice a day.  Literature  suggests to increase at 4 g intervals a week, and this is consistent with that  Patient advised to limit caffeine, sugar, beverages with sugar sweeteners, candy or gum  Pelvic floor physical therapy recommended, but patient would like to continue to do this at home  Patient thinks Metamucil also helps her symptoms,    Dr [name]

## 2020-07-24 ENCOUNTER — Telehealth: Payer: Self-pay | Admitting: Family Medicine

## 2020-07-24 DIAGNOSIS — I10 Essential (primary) hypertension: Secondary | ICD-10-CM

## 2020-07-24 MED ORDER — LISINOPRIL-HYDROCHLOROTHIAZIDE 20-12.5 MG PO TABS: 1.0000 | ORAL_TABLET | Freq: Every day | ORAL | 0 refills | Status: DC

## 2020-07-24 NOTE — Telephone Encounter (Signed)
[facility] faxed refill request for the following medications:  1. lisinopril-hydrochlorothiazide (ZESTORETIC) 20-12.5 MG tablet  2. levothyroxine (SYNTHROID) 112 MCG tablet  LOV: 01/25/2020 Please advise. Thanks TNP

## 2020-07-25 ENCOUNTER — Encounter: Payer: Self-pay | Admitting: Adult Health

## 2020-07-25 ENCOUNTER — Other Ambulatory Visit: Payer: Self-pay | Admitting: Family Medicine

## 2020-07-25 MED ORDER — LEVOTHYROXINE SODIUM 112 MCG PO TABS
112.0000 ug | ORAL_TABLET | Freq: Every day | ORAL | 0 refills | Status: DC
Start: 2020-07-25 — End: 2020-07-26

## 2020-07-25 NOTE — Telephone Encounter (Signed)
Please review. 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 Ok to refill?

## 2020-07-25 NOTE — Telephone Encounter (Signed)
[facility] faxed refill request for the following medications:  levothyroxine (SYNTHROID) 112 MCG tablet  90 day supply  Last Rx: 08/10/19 Qty: 90 Refills: 3 LOV: 01/25/20 Please advise. Thanks TNP

## 2020-07-26 ENCOUNTER — Other Ambulatory Visit: Payer: Self-pay | Admitting: Adult Health

## 2020-07-26 DIAGNOSIS — E039 Hypothyroidism, unspecified: Secondary | ICD-10-CM

## 2020-07-26 DIAGNOSIS — I10 Essential (primary) hypertension: Secondary | ICD-10-CM

## 2020-07-26 MED ORDER — LEVOTHYROXINE SODIUM 112 MCG PO TABS: 112.0000 ug | ORAL_TABLET | Freq: Every day | ORAL | 1 refills | Status: AC

## 2020-07-26 MED ORDER — LISINOPRIL-HYDROCHLOROTHIAZIDE 20-12.5 MG PO TABS: 1.0000 | ORAL_TABLET | Freq: Every day | ORAL | 1 refills | Status: DC

## 2020-07-26 NOTE — Telephone Encounter (Signed)
Called and spoke to Brenda Horton. Brenda Horton has an in person appointment with [facility] September 2022. She scheduled for a video visit with michelle on 07/27/2020 at 1:30 to have medications refilled.

## 2020-07-27 ENCOUNTER — Telehealth: Payer: Medicare HMO | Admitting: Adult Health

## 2020-08-02 ENCOUNTER — Telehealth (INDEPENDENT_AMBULATORY_CARE_PROVIDER_SITE_OTHER): Payer: Medicare HMO | Admitting: Adult Health

## 2020-08-02 ENCOUNTER — Ambulatory Visit: Payer: Medicare HMO | Admitting: Gastroenterology

## 2020-08-02 ENCOUNTER — Encounter: Payer: Self-pay | Admitting: Adult Health

## 2020-08-02 VITALS — Ht 60.98 in | Wt 157.0 lb

## 2020-08-02 DIAGNOSIS — I10 Essential (primary) hypertension: Secondary | ICD-10-CM | POA: Diagnosis not present

## 2020-08-02 DIAGNOSIS — E039 Hypothyroidism, unspecified: Secondary | ICD-10-CM

## 2020-08-02 NOTE — Progress Notes (Signed)
Virtual Visit via Telephone Note  I connected with Brenda Horton on 08/02/20 at  1:30 PM EDT by telephone and verified that I am speaking with the correct person using two identifiers. Parties involved in visit as below:   Location: Patient: at home  Provider: Provider: Provider's office at  [facility], [place].      I discussed the limitations, risks, security and privacy concerns of performing an evaluation and management service by telephone and the availability of in person appointments. I also discussed with the patient that there may be a patient responsible charge related to this service. The patient expressed understanding and agreed to proceed.   History of Present Illness: Patient presents for follow-up on hypothyroidism.  She is currently taking Synthroid as prescribed and also reports that her levels have been stable for the last 5 years.  She denies any new or changing or worsening symptoms.  She feels well otherwise.  She is due for a physical and will schedule that in the near future.   Observations/Objective:   Patient is alert and oriented and responsive to questions Engages in conversation with provider. Speaks in full sentences without any pauses without any shortness of breath or distress.  Assessment and Plan: 1. Hypothyroidism, unspecified type - CBC with Differential/Platelet; Future - Comprehensive metabolic panel; Future - Lipid panel; Future - TSH; Future  2. Hypertension, unspecified type Labs ordered for prior to physical patient will call and schedule those. - TSH; Future  Follow Up Instructions: Advised in person evaluation at anytime is advised if any symptoms do not improve, worsen or change at any given time.  Red Flags discussed. The patient was given clear instructions to go to ER or return to medical center if any red flags develop, symptoms do not improve, worsen or new problems develop. They verbalized  understanding.   I discussed the assessment and treatment plan with the patient. The patient was provided an opportunity to ask questions and all were answered. The patient agreed with the plan and demonstrated an understanding of the instructions.   The patient was advised to call back or seek an in-person evaluation if the symptoms worsen or if the condition fails to improve as anticipated.  Time spent 20 minutes.   Marcille Buffy, FNP

## 2020-08-02 NOTE — Patient Instructions (Signed)
Health Maintenance, Female Adopting a healthy lifestyle and getting preventive care are important in promoting health and wellness. Ask your health care provider about:  The right schedule for you to have regular tests and exams.  Things you can do on your own to prevent diseases and keep yourself healthy. What should I know about diet, weight, and exercise? Eat a healthy diet  Eat a diet that includes plenty of vegetables, fruits, low-fat dairy products, and lean protein.  Do not eat a lot of foods that are high in solid fats, added sugars, or sodium.   Maintain a healthy weight Body mass index (BMI) is used to identify weight problems. It estimates body fat based on height and weight. Your health care provider can help determine your BMI and help you achieve or maintain a healthy weight. Get regular exercise Get regular exercise. This is one of the most important things you can do for your health. Most adults should:  Exercise for at least 150 minutes each week. The exercise should increase your heart rate and make you sweat (moderate-intensity exercise).  Do strengthening exercises at least twice a week. This is in addition to the moderate-intensity exercise.  Spend less time sitting. Even light physical activity can be beneficial. Watch cholesterol and blood lipids Have your blood tested for lipids and cholesterol at 85 years of age, then have this test every 5 years. Have your cholesterol levels checked more often if:  Your lipid or cholesterol levels are high.  You are older than 85 years of age.  You are at high risk for heart disease. What should I know about cancer screening? Depending on your health history and family history, you may need to have cancer screening at various ages. This may include screening for:  Breast cancer.  Cervical cancer.  Colorectal cancer.  Skin cancer.  Lung cancer. What should I know about heart disease, diabetes, and high blood  pressure? Blood pressure and heart disease  High blood pressure causes heart disease and increases the risk of stroke. This is more likely to develop in people who have high blood pressure readings, are of African descent, or are overweight.  Have your blood pressure checked: ? Every 3-5 years if you are 18-39 years of age. ? Every year if you are 40 years old or older. Diabetes Have regular diabetes screenings. This checks your fasting blood sugar level. Have the screening done:  Once every three years after age 40 if you are at a normal weight and have a low risk for diabetes.  More often and at a younger age if you are overweight or have a high risk for diabetes. What should I know about preventing infection? Hepatitis B If you have a higher risk for hepatitis B, you should be screened for this virus. Talk with your health care provider to find out if you are at risk for hepatitis B infection. Hepatitis C Testing is recommended for:  Everyone born from 1945 through 1965.  Anyone with known risk factors for hepatitis C. Sexually transmitted infections (STIs)  Get screened for STIs, including gonorrhea and chlamydia, if: ? You are sexually active and are younger than 85 years of age. ? You are older than 85 years of age and your health care provider tells you that you are at risk for this type of infection. ? Your sexual activity has changed since you were last screened, and you are at increased risk for chlamydia or gonorrhea. Ask your health care provider   if you are at risk.  Ask your health care provider about whether you are at high risk for HIV. Your health care provider may recommend a prescription medicine to help prevent HIV infection. If you choose to take medicine to prevent HIV, you should first get tested for HIV. You should then be tested every 3 months for as long as you are taking the medicine. Pregnancy  If you are about to stop having your period (premenopausal) and  you may become pregnant, seek counseling before you get pregnant.  Take 400 to 800 micrograms (mcg) of folic acid every day if you become pregnant.  Ask for birth control (contraception) if you want to prevent pregnancy. Osteoporosis and menopause Osteoporosis is a disease in which the bones lose minerals and strength with aging. This can result in bone fractures. If you are 65 years old or older, or if you are at risk for osteoporosis and fractures, ask your health care provider if you should:  Be screened for bone loss.  Take a calcium or vitamin D supplement to lower your risk of fractures.  Be given hormone replacement therapy (HRT) to treat symptoms of menopause. Follow these instructions at home: Lifestyle  Do not use any products that contain nicotine or tobacco, such as cigarettes, e-cigarettes, and chewing tobacco. If you need help quitting, ask your health care provider.  Do not use street drugs.  Do not share needles.  Ask your health care provider for help if you need support or information about quitting drugs. Alcohol use  Do not drink alcohol if: ? Your health care provider tells you not to drink. ? You are pregnant, may be pregnant, or are planning to become pregnant.  If you drink alcohol: ? Limit how much you use to 0-1 drink a day. ? Limit intake if you are breastfeeding.  Be aware of how much alcohol is in your drink. In the [place] one drink equals one 12 oz bottle of beer (355 mL), one 5 oz glass of wine (148 mL), or one 1 oz glass of hard liquor (44 mL). General instructions  Schedule regular health, dental, and eye exams.  Stay current with your vaccines.  Tell your health care provider if: ? You often feel depressed. ? You have ever been abused or do not feel safe at home. Summary  Adopting a healthy lifestyle and getting preventive care are important in promoting health and wellness.  Follow your health care provider's instructions about healthy  diet, exercising, and getting tested or screened for diseases.  Follow your health care provider's instructions on monitoring your cholesterol and blood pressure. This information is not intended to replace advice given to you by your health care provider. Make sure you discuss any questions you have with your health care provider. Document Revised: 02/18/2018 Document Reviewed: 02/18/2018 Elsevier Patient Education  2021 Elsevier Inc.  

## 2020-08-07 ENCOUNTER — Encounter: Payer: Self-pay | Admitting: Adult Health

## 2020-08-17 ENCOUNTER — Other Ambulatory Visit: Payer: Self-pay

## 2020-08-17 ENCOUNTER — Ambulatory Visit: Payer: Medicare HMO | Admitting: Podiatry

## 2020-08-17 ENCOUNTER — Encounter: Payer: Self-pay | Admitting: Podiatry

## 2020-08-17 DIAGNOSIS — L84 Corns and callosities: Secondary | ICD-10-CM | POA: Diagnosis not present

## 2020-08-17 DIAGNOSIS — M2011 Hallux valgus (acquired), right foot: Secondary | ICD-10-CM | POA: Diagnosis not present

## 2020-08-17 DIAGNOSIS — M216X2 Other acquired deformities of left foot: Secondary | ICD-10-CM | POA: Diagnosis not present

## 2020-08-17 NOTE — Progress Notes (Signed)
This patient returns to the office for evaluation and treatment of painful callus on left forefoot.  She says she has started walking which has led to callus formation.  She says these callus are painful walking and wearing her shoes.  She presents to the office for treatment of these callus.  General Appearance  Alert, conversant and in no acute stress.  Vascular  Dorsalis pedis and posterior tibial  pulses are palpable  bilaterally.  Capillary return is within normal limits  bilaterally. Temperature is within normal limits  bilaterally.  Neurologic  Senn-Weinstein monofilament wire test within normal limits  bilaterally. Muscle power within normal limits bilaterally.  Nails Thick disfigured discolored nails with subungual debris  First and second toenails left foot.. No evidence of bacterial infection or drainage bilaterally.  Orthopedic  No limitations of motion  feet .  No crepitus or effusions noted.  No bony pathology or digital deformities noted. Prominent metatarsal heads left foot.  Skin  normotropic skin noted bilaterally.  No signs of infections or ulcers noted.   Callus noted sub 2,4  Left foot.  Porokeratosis left forefoot.  Debridement  of callus with # 15 blade..  RTC prn   Gardiner Barefoot DPM

## 2020-08-25 ENCOUNTER — Emergency Department
Admission: EM | Admit: 2020-08-25 | Discharge: 2020-08-25 | Disposition: A | Discharge status: Home / Self Care | Payer: Medicare HMO | Attending: Emergency Medicine | Admitting: Emergency Medicine

## 2020-08-25 ENCOUNTER — Emergency Department: Payer: Medicare HMO

## 2020-08-25 ENCOUNTER — Ambulatory Visit: Payer: Self-pay | Admitting: Nurse Practitioner

## 2020-08-25 ENCOUNTER — Other Ambulatory Visit: Payer: Self-pay

## 2020-08-25 ENCOUNTER — Encounter: Payer: Self-pay | Admitting: Emergency Medicine

## 2020-08-25 DIAGNOSIS — X58XXXA Exposure to other specified factors, initial encounter: Secondary | ICD-10-CM | POA: Diagnosis not present

## 2020-08-25 DIAGNOSIS — S46912A Strain of unspecified muscle, fascia and tendon at shoulder and upper arm level, left arm, initial encounter: Secondary | ICD-10-CM

## 2020-08-25 DIAGNOSIS — I129 Hypertensive chronic kidney disease with stage 1 through stage 4 chronic kidney disease, or unspecified chronic kidney disease: Secondary | ICD-10-CM | POA: Insufficient documentation

## 2020-08-25 DIAGNOSIS — N183 Chronic kidney disease, stage 3 unspecified: Secondary | ICD-10-CM | POA: Diagnosis not present

## 2020-08-25 DIAGNOSIS — Z79899 Other long term (current) drug therapy: Secondary | ICD-10-CM | POA: Diagnosis not present

## 2020-08-25 DIAGNOSIS — E039 Hypothyroidism, unspecified: Secondary | ICD-10-CM | POA: Diagnosis not present

## 2020-08-25 DIAGNOSIS — S4992XA Unspecified injury of left shoulder and upper arm, initial encounter: Secondary | ICD-10-CM | POA: Diagnosis present

## 2020-08-25 DIAGNOSIS — M25512 Pain in left shoulder: Secondary | ICD-10-CM

## 2020-08-25 MED ORDER — TRAMADOL HCL 50 MG PO TABS
50.0000 mg | ORAL_TABLET | Freq: Once | ORAL | Status: AC
Start: 2020-08-25 — End: 2020-08-25
  Administered 2020-08-25: 50 mg via ORAL
  Filled 2020-08-25: qty 1

## 2020-08-25 MED ORDER — LIDOCAINE 5 % EX PTCH: 1.0000 | MEDICATED_PATCH | Freq: Two times a day (BID) | CUTANEOUS | 0 refills | Status: AC

## 2020-08-25 MED ORDER — LIDOCAINE 5 % EX PTCH
1.0000 | MEDICATED_PATCH | CUTANEOUS | Status: DC
  Administered 2020-08-25: 1 via TRANSDERMAL
  Filled 2020-08-25: qty 1

## 2020-08-25 MED ORDER — DICLOFENAC SODIUM 25 MG PO TBEC: 25.0000 mg | DELAYED_RELEASE_TABLET | Freq: Two times a day (BID) | ORAL | 1 refills | Status: DC

## 2020-08-25 NOTE — ED Provider Notes (Signed)
[facility] Emergency Department Provider Note   ____________________________________________   Event Date/Time   First MD Initiated Contact with Patient 08/25/20 1011     (approximate)  I have reviewed the triage vital signs and the nursing notes.   HISTORY  Chief Complaint Back Pain and Shoulder Pain    HPI Brenda Horton is a 87 y.o. female patient presents with upper back and posterior shoulder pain for few days.  Patient states similar complaint 6 months ago and was treated by chiropractor for months with complete relief.  Patient denies provocative incident for complaint.  Patient states pain increased with abduction and overhead reaching.  Patient is ambidextrous but uses her left upper extremity for most tasks.  Patient stated pain is a constant 1-2 over 10 unless movement which is increases pain to a 5/10.  No palliative measure for complaint.         Past Medical History:  Diagnosis Date   CKD (chronic kidney disease) stage 3, GFR 30-59 ml/min (HCC) 03/09/2018   Hypertension    Macular degeneration    Thyroid disease     Patient Active Problem List   Diagnosis Date Noted   Callus 04/27/2020   Prominent metatarsal head of left foot 04/27/2020   Pincer nail deformity 05/06/2019   Hav (hallux abducto valgus), right 11/23/2018   Colon polyps 08/24/2018   Pain due to onychomycosis of toenails of both feet 08/24/2018   Plantar fasciitis, left 08/24/2018   Hypertension 03/09/2018   Hypothyroidism 03/09/2018   Diarrhea due to malabsorption 11/02/2015   Osteopenia 10/22/2013    Past Surgical History:  Procedure Laterality Date   ABDOMINAL HYSTERECTOMY     Partial   EYE SURGERY     FRACTURE SURGERY      Prior to Admission medications   Medication Sig Start Date End Date Taking? Authorizing Provider  diclofenac (VOLTAREN) 25 MG EC tablet Take 1 tablet (25 mg total) by mouth 2 (two) times daily. 08/25/20  Yes Sable Feil, PA-C   lidocaine (LIDODERM) 5 % Place 1 patch onto the skin every 12 (twelve) hours. Remove & Discard patch within 12 hours or as directed by MD 08/25/20 08/25/21 Yes Sable Feil, PA-C  Coenzyme Q10 10 MG capsule Take 10 mg by mouth daily.     [provider]  colestipol (COLESTID) 1 g tablet Take 4 tablets (4 g total) by mouth 2 (two) times daily. 07/12/20 08/11/20  Virgel Manifold, MD  levothyroxine (SYNTHROID) 112 MCG tablet Take 1 tablet (112 mcg total) by mouth daily before breakfast. 07/26/20   Flinchum, Kelby Aline, FNP  lisinopril-hydrochlorothiazide (ZESTORETIC) 20-12.5 MG tablet Take 1 tablet by mouth daily. 07/26/20   Flinchum, Kelby Aline, FNP  Multiple Vitamin (MULTIVITAMIN) capsule Take 1 capsule by mouth daily.     [provider]  Multiple Vitamins-Minerals (PRESERVISION AREDS 2) CAPS Take 2 capsules by mouth daily.     [provider]  Probiotic Product (ALIGN) 4 MG CAPS Take 4 mg by mouth daily.    [provider]  psyllium (METAMUCIL) 58.6 % packet Take 1 packet by mouth daily.     [provider]  RESTASIS 0.05 % ophthalmic emulsion Place 1 drop into both eyes 2 (two) times daily.  04/27/19   [provider]    Allergies Patient has no known allergies.  Family History  Problem Relation Age of Onset   Hyperthyroidism Mother    Hypertension Father    Hyperthyroidism Daughter  Arthritis Daughter    Rheum arthritis Daughter    Hyperthyroidism Son     Social History Social History   Tobacco Use   Smoking status: Never   Smokeless tobacco: Never  Vaping Use   Vaping Use: Never used  Substance Use Topics   Alcohol use: Yes    Comment: maybe 2 glasses of wine a month   Drug use: Never    Review of Systems Constitutional: No fever/chills Eyes: No visual changes.  Macular degeneration. ENT: No sore throat. Cardiovascular: Denies chest pain. Respiratory: Denies shortness of breath. Gastrointestinal: No abdominal  pain.  No nausea, no vomiting.  No diarrhea.  No constipation. Genitourinary: Negative for dysuria. Musculoskeletal: Left upper back and posterior shoulder pain. Skin: Negative for rash. Neurological: Negative for headaches, focal weakness or numbness. Endocrine: CKD, hypertension, and hypothyroidism. Hematological/Lymphatic:    ____________________________________________   PHYSICAL EXAM:  VITAL SIGNS: ED Triage Vitals  Enc Vitals Group     BP 08/25/20 0912 (!) 186/69     Pulse Rate 08/25/20 0912 68     Resp 08/25/20 0912 16     Temp 08/25/20 0912 98.3 F (36.8 C)     Temp Source 08/25/20 0912 Oral     SpO2 08/25/20 0912 100 %     Weight 08/25/20 0914 157 lb (71.2 kg)     Height 08/25/20 0914 5\' 1"  (1.549 m)     Head Circumference --      Peak Flow --      Pain Score 08/25/20 0914 1     Pain Loc --      Pain Edu? --      Excl. in [place]? --     Constitutional: Alert and oriented. Well appearing and in no acute distress. Neck: No cervical spine tenderness to palpation. Cardiovascular: Normal rate, regular rhythm. Grossly normal heart sounds.  Good peripheral circulation.  Elevated blood pressure. Respiratory: Normal respiratory effort.  No retractions. Lungs CTAB. Genitourinary: Deferred Musculoskeletal: No obvious deformity to the left shoulder.  Patient has decreased range of motion with adduction and overhead reaching. Neurologic:  Normal speech and language. No gross focal neurologic deficits are appreciated. No gait instability. Skin:  Skin is warm, dry and intact. No rash noted.  No abrasion, ecchymosis, or edema. Psychiatric: Mood and affect are normal. Speech and behavior are normal.  ____________________________________________   LABS (all labs ordered are listed, but only abnormal results are displayed)  Labs Reviewed - No data to display ____________________________________________  EKG   ____________________________________________  RADIOLOGY I,  Sable Feil, personally viewed and evaluated these images (plain radiographs) as part of my medical decision making, as well as reviewing the written report by the radiologist.  ED MD interpretation: No acute findings x-ray of the left shoulder.  Mild degenerative changes are appreciated.  Official radiology report(s): DG Shoulder Left  Result Date: 08/25/2020 CLINICAL DATA:  87 year old female with left shoulder pain and decreased range of motion with overhead reaching since January. EXAM: LEFT SHOULDER - 2+ VIEW COMPARISON:  None. FINDINGS: Bone mineralization is within normal limits for age. No glenohumeral joint dislocation. Mild glenohumeral joint space loss and degenerative spurring. Proximal left humerus intact. Mild to moderate dystrophic calcification about the left AC joint. Left clavicle and scapula appear intact. Negative visible left ribs and chest. IMPRESSION: Mild to moderate for age degenerative. No acute osseous abnormality identified. Electronically Signed   By: Genevie Ann M.D.   On: 08/25/2020 10:54    ____________________________________________  PROCEDURES  Procedure(s) performed (including Critical Care):  Procedures   ____________________________________________   INITIAL IMPRESSION / ASSESSMENT AND PLAN / ED COURSE  As part of my medical decision making, I reviewed the following data within the [facility]         Patient presents with upper back and left medial shoulder pain.  Discussed x-ray findings with patient.  Patient complaint and physical exam consistent with scapular muscle strain.  Patient given discharge care instruction advised take medication as directed.  Patient advised follow-up with treating chiropractor.      ____________________________________________   FINAL CLINICAL IMPRESSION(S) / ED DIAGNOSES  Final diagnoses:  Muscle strain of left scapular region, initial encounter  Localized pain of left shoulder joint      ED Discharge Orders          Ordered    lidocaine (LIDODERM) 5 %  Every 12 hours        08/25/20 1107    diclofenac (VOLTAREN) 25 MG EC tablet  2 times daily        08/25/20 1107             Note:  This document was prepared using Dragon voice recognition software and may include unintentional dictation errors.    Sable Feil, PA-C 08/25/20 1111    Lavonia Drafts, MD 08/25/20 1147

## 2020-08-25 NOTE — ED Notes (Signed)
First Nurse Note: Pt to ED for neck and shoulder pain. Pt is in NAD.

## 2020-08-25 NOTE — ED Triage Notes (Signed)
Pt to ED via POV, pt states that she is having pain in her upper back that goes over into her left shoulder. Pt states that it is painful when she tries to lift her arm. Pt reports that she had similar pain in January and went to the Chiropractor, pain went away but came back again this week. Pt is in NAD.

## 2020-08-25 NOTE — Discharge Instructions (Addendum)
Advised to follow-up with chiropractor for definitive evaluation and treatment.  Take medication as needed.

## 2020-08-25 NOTE — ED Notes (Signed)
See triage note  Presents with left shoulder pain   States pain is posterior shoulder and neck  No deformity noted  Denies any recent injury  Good pulses  Increased pain with movement

## 2020-11-16 ENCOUNTER — Encounter: Payer: Self-pay | Admitting: Gastroenterology

## 2020-11-16 ENCOUNTER — Other Ambulatory Visit: Payer: Self-pay

## 2020-11-16 ENCOUNTER — Ambulatory Visit: Payer: Medicare HMO | Admitting: Gastroenterology

## 2020-11-16 VITALS — BP 166/66 | HR 65 | Temp 98.2°F | Wt 156.0 lb

## 2020-11-16 DIAGNOSIS — R195 Other fecal abnormalities: Secondary | ICD-10-CM | POA: Diagnosis not present

## 2020-11-17 NOTE — Progress Notes (Signed)
Brenda Antigua, MD [address]  [place]  [place], [place] [address]  Main: [phone]  Fax: [phone]   Primary Care Physician: [place]   Chief complaint: Loose stools  HPI: Brenda Horton is a 87 y.o. female here for follow-up of loose stools.  Patient reports improvement in symptoms with Metamucil and has also been avoiding lactose.  Patient has realized that when she consumes lactose products, she has looser stools.  She is not taking colestipol given that the above measures are helping her without the colestipol.  No abdominal pain, nausea or vomiting.  Reports good appetite.  No weight loss.   ROS: All ROS reviewed and negative except as per HPI   Past Medical History:  Diagnosis Date   CKD (chronic kidney disease) stage 3, GFR 30-59 ml/min (HCC) 03/09/2018   Hypertension    Macular degeneration    Thyroid disease     Past Surgical History:  Procedure Laterality Date   ABDOMINAL HYSTERECTOMY     Partial   EYE SURGERY     FRACTURE SURGERY      Prior to Admission medications   Medication Sig Start Date End Date Taking? Authorizing Provider  Coenzyme Q10 10 MG capsule Take 10 mg by mouth daily.    Yes [provider]  diclofenac (VOLTAREN) 25 MG EC tablet Take 1 tablet (25 mg total) by mouth 2 (two) times daily. 08/25/20  Yes Sable Feil, PA-C  levothyroxine (SYNTHROID) 112 MCG tablet Take 1 tablet (112 mcg total) by mouth daily before breakfast. 07/26/20  Yes Flinchum, Kelby Aline, FNP  lidocaine (LIDODERM) 5 % Place 1 patch onto the skin every 12 (twelve) hours. Remove & Discard patch within 12 hours or as directed by MD 08/25/20 08/25/21 Yes Sable Feil, PA-C  lisinopril-hydrochlorothiazide (ZESTORETIC) 20-12.5 MG tablet Take 1 tablet by mouth daily. 07/26/20  Yes Flinchum, Kelby Aline, FNP  Multiple Vitamin (MULTIVITAMIN) capsule Take 1 capsule by mouth daily.    Yes [provider]  Multiple Vitamins-Minerals  (PRESERVISION AREDS 2) CAPS Take 2 capsules by mouth daily.    Yes [provider]  Probiotic Product (ALIGN) 4 MG CAPS Take 4 mg by mouth daily.   Yes [provider]  psyllium (METAMUCIL) 58.6 % packet Take 1 packet by mouth daily.    Yes [provider]  RESTASIS 0.05 % ophthalmic emulsion Place 1 drop into both eyes 2 (two) times daily.  04/27/19  Yes [provider]  colestipol (COLESTID) 1 g tablet Take 4 tablets (4 g total) by mouth 2 (two) times daily. 07/12/20 08/11/20  Virgel Manifold, MD    Family History  Problem Relation Age of Onset   Hyperthyroidism Mother    Hypertension Father    Hyperthyroidism Daughter    Arthritis Daughter    Rheum arthritis Daughter    Hyperthyroidism Son      Social History   Tobacco Use   Smoking status: Never   Smokeless tobacco: Never  Vaping Use   Vaping Use: Never used  Substance Use Topics   Alcohol use: Yes    Comment: maybe 2 glasses of wine a month   Drug use: Never    Allergies as of 11/16/2020   (No Known Allergies)    Physical Examination:  Constitutional: General:   Alert,  Well-developed, well-nourished, pleasant and cooperative in NAD BP (!) 166/66   Pulse 65   Temp 98.2 F (36.8 C) (Oral)   Wt 156  lb (70.8 kg)   BMI 29.48 kg/m   Respiratory: Normal respiratory effort  Gastrointestinal:  Soft, non-tender and non-distended without masses, hepatosplenomegaly or hernias noted.  No guarding or rebound tenderness.     Cardiac: No clubbing or edema.  No cyanosis. Normal posterior tibial pedal pulses noted.  Psych:  Alert and cooperative. Normal mood and affect.  Musculoskeletal:  Normal gait. Head normocephalic, atraumatic. Symmetrical without gross deformities. 5/5 Lower extremity strength bilaterally.  Skin: Warm. Intact without significant lesions or rashes. No jaundice.  Neck: Supple, trachea midline  Lymph: No cervical lymphadenopathy  Psych:  Alert and oriented x3,  Alert and cooperative. Normal mood and affect.  Labs: CMP     Component Value Date/Time   NA 140 01/05/2020 1015   K 3.9 01/05/2020 1015   CL 101 01/05/2020 1015   CO2 21 01/05/2020 1015   GLUCOSE 99 01/05/2020 1015   BUN 27 01/05/2020 1015   CREATININE 1.11 (H) 01/05/2020 1015   CALCIUM 9.5 01/05/2020 1015   PROT 6.6 01/05/2020 1015   ALBUMIN 4.4 01/05/2020 1015   AST 17 01/05/2020 1015   ALT 15 01/05/2020 1015   ALKPHOS 31 (L) 01/05/2020 1015   BILITOT 0.4 01/05/2020 1015   GFRNONAA 44 (L) 01/05/2020 1015   GFRAA 51 (L) 01/05/2020 1015   Lab Results  Component Value Date   WBC 5.6 01/05/2020   HGB 13.0 01/05/2020   HCT 40.0 01/05/2020   MCV 106 (H) 01/05/2020   PLT 214 01/05/2020    Imaging Studies:   Assessment and Plan:   Brenda Horton is a 87 y.o. y/o female here for follow-up of loose stools  With a combination of Metamucil, to help bulk stool, and avoiding lactose, patient reports improvement in her loose stools and has not had fecal incontinence.    She discontinued her Colestid as she felt that she did not needed due to improvement in symptoms with above measures  If symptoms recur, may need to reconsider Colestid, versus pelvic floor physical therapy (patient refuses official referral), versus colonoscopy.  Patient advised to call us back if symptoms recur and she verbalized understanding  Patient also advised to keep a food diary, and continue to avoid lactose if it is causing symptoms  Patient advised to avoid beverages with sugar or sugar substitutes as it can worsen loose stools  Dr Brenda Horton

## 2020-12-06 ENCOUNTER — Encounter: Payer: Medicare HMO | Admitting: Adult Health

## 2021-01-25 ENCOUNTER — Encounter: Payer: Medicare HMO | Admitting: Physician Assistant

## 2021-02-25 ENCOUNTER — Other Ambulatory Visit: Payer: Self-pay | Admitting: Physician Assistant

## 2021-10-21 IMAGING — CR DG SHOULDER 2+V*L*
1 series · 3 of 3 positions shown · non-contrast
Comparison: None.

CLINICAL DATA: 89-year-old female with left shoulder pain and
decreased range of motion with overhead reaching since [REDACTED].

EXAM:
LEFT SHOULDER - 2+ VIEW

[Series 1: dg shoulder left · 0.14mm/px · 3 of 3 slices shown]
[im 1/3]
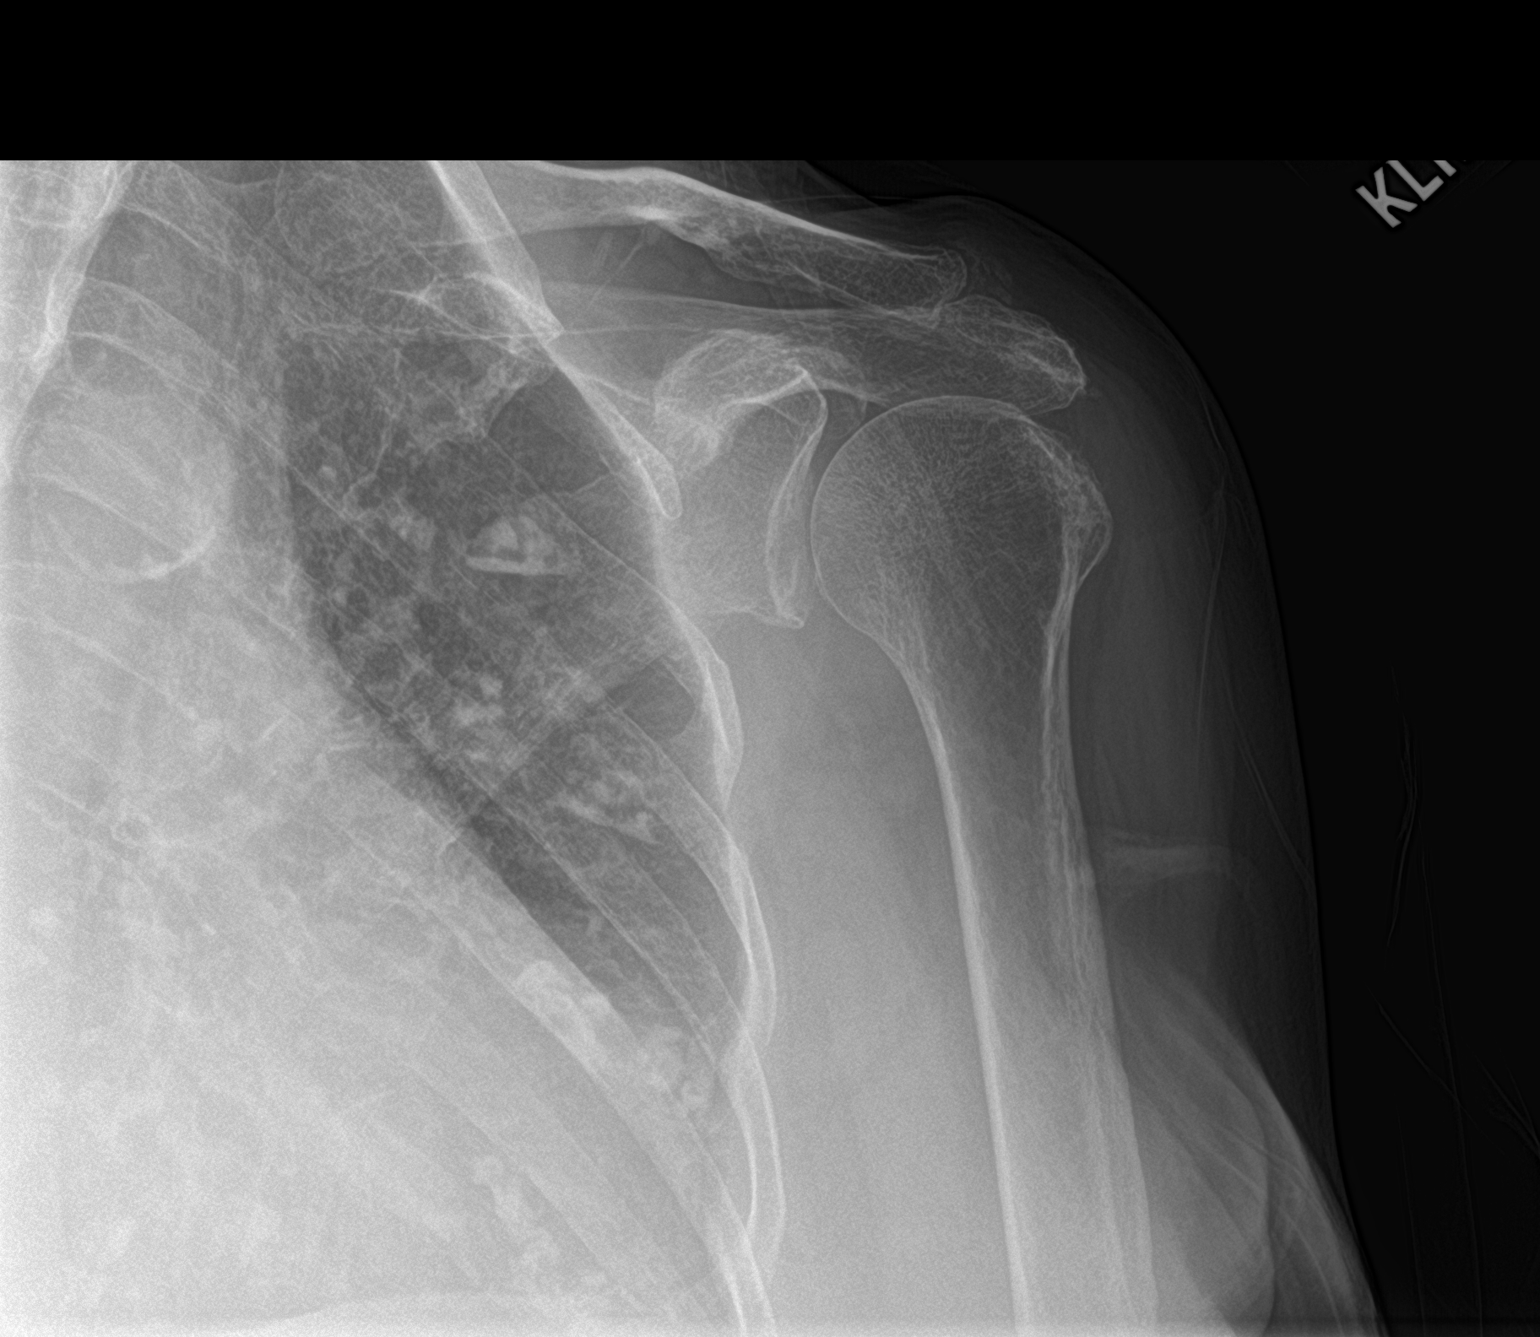
[im 2/3]
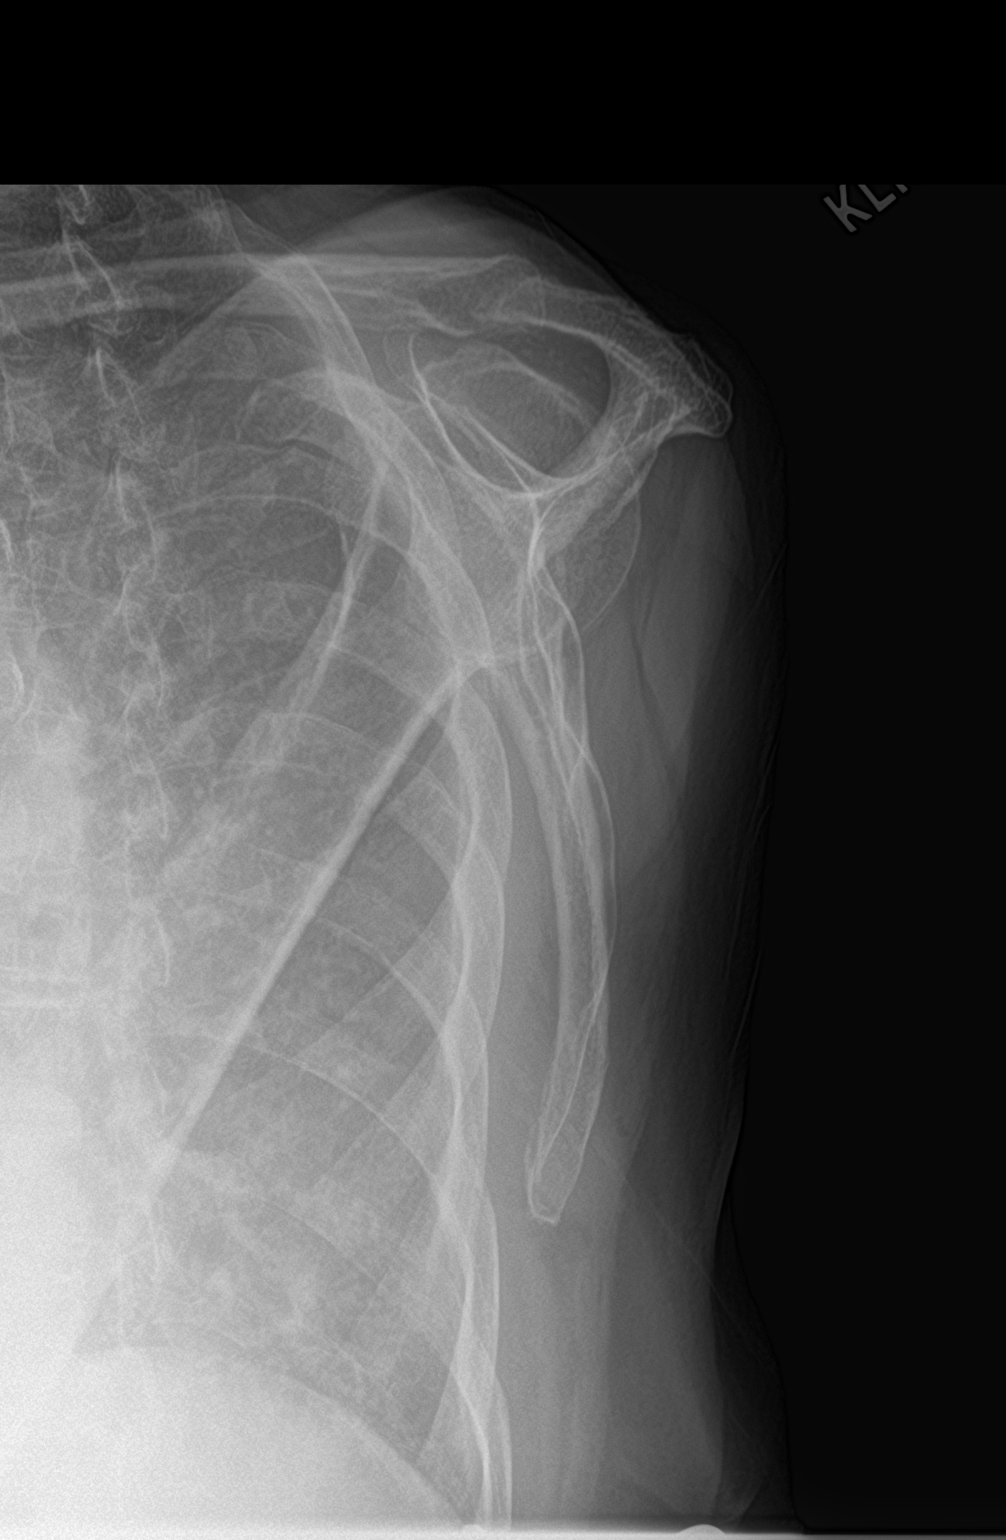
[im 3/3]
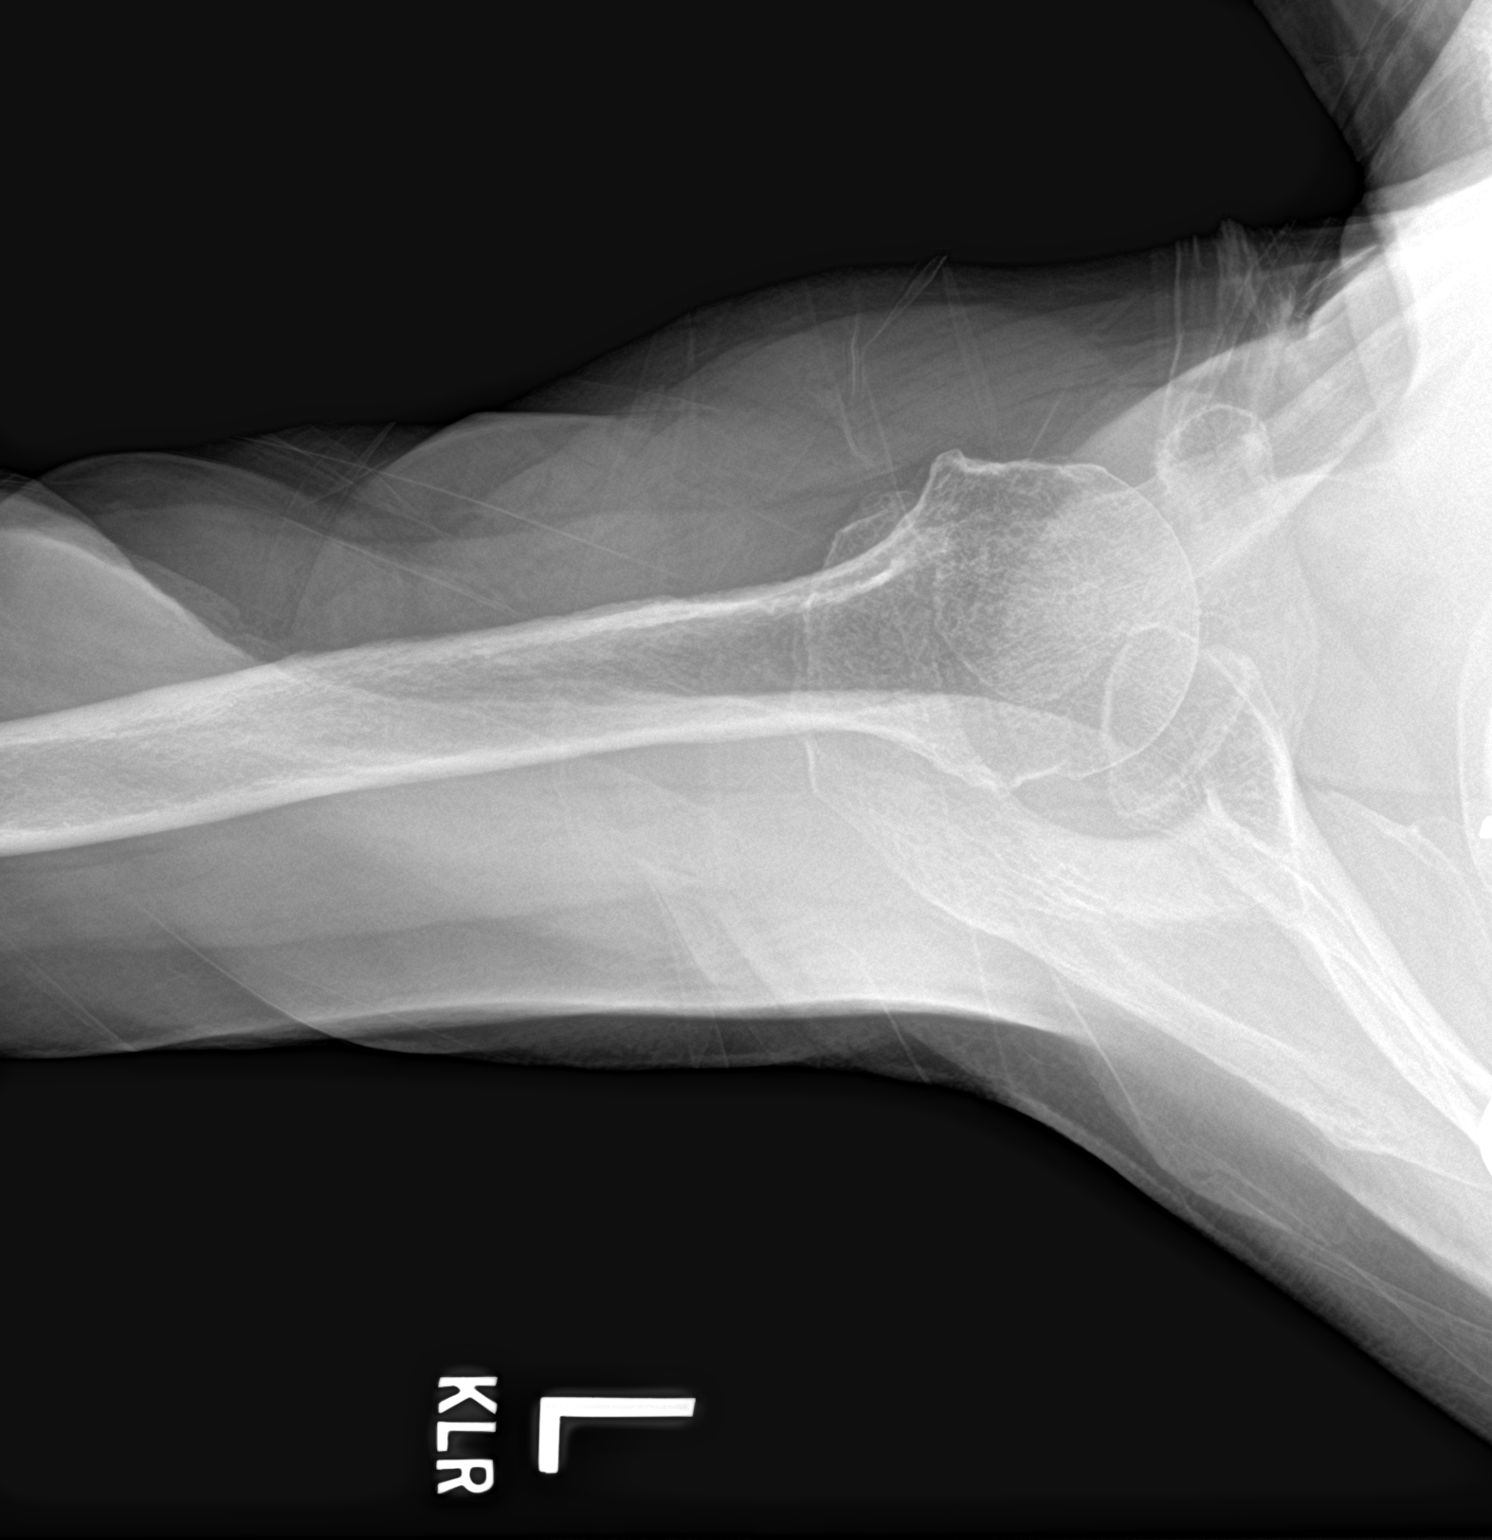

[3 of 3 positions shown; findings below may reference images not displayed]

FINDINGS: Bone mineralization is within normal limits for age. No glenohumeral
joint dislocation. Mild glenohumeral joint space loss and
degenerative spurring. Proximal left humerus intact. Mild to
moderate dystrophic calcification about the left AC joint. Left
clavicle and scapula appear intact.

Negative visible left ribs and chest.
IMPRESSION: Mild to moderate for age degenerative. No acute osseous abnormality
identified.

## 2023-05-03 ENCOUNTER — Emergency Department: Payer: Medicare HMO

## 2023-05-03 ENCOUNTER — Emergency Department
Admission: EM | Admit: 2023-05-03 | Discharge: 2023-05-03 | Disposition: A | Discharge status: Home / Self Care | Payer: Medicare HMO | Attending: Emergency Medicine | Admitting: Emergency Medicine

## 2023-05-03 ENCOUNTER — Other Ambulatory Visit: Payer: Self-pay

## 2023-05-03 DIAGNOSIS — I129 Hypertensive chronic kidney disease with stage 1 through stage 4 chronic kidney disease, or unspecified chronic kidney disease: Secondary | ICD-10-CM | POA: Insufficient documentation

## 2023-05-03 DIAGNOSIS — R4182 Altered mental status, unspecified: Secondary | ICD-10-CM | POA: Insufficient documentation

## 2023-05-03 DIAGNOSIS — N189 Chronic kidney disease, unspecified: Secondary | ICD-10-CM | POA: Insufficient documentation

## 2023-05-03 DIAGNOSIS — R4701 Aphasia: Secondary | ICD-10-CM | POA: Diagnosis not present

## 2023-05-03 DIAGNOSIS — R410 Disorientation, unspecified: Secondary | ICD-10-CM

## 2023-05-03 LAB — COMPREHENSIVE METABOLIC PANEL
ALT: 16 U/L (ref 0–44)
AST: 19 U/L (ref 15–41)
Albumin: 3.9 g/dL (ref 3.5–5.0)
Alkaline Phosphatase: 25 U/L — ABNORMAL LOW (ref 38–126)
Anion gap: 12 (ref 5–15)
BUN: 29 mg/dL — ABNORMAL HIGH (ref 8–23)
CO2: 24 mmol/L (ref 22–32)
Calcium: 9.2 mg/dL (ref 8.9–10.3)
Chloride: 102 mmol/L (ref 98–111)
Creatinine, Ser: 1.01 mg/dL — ABNORMAL HIGH (ref 0.44–1.00)
GFR, Estimated: 53 mL/min — ABNORMAL LOW (ref >60)
Glucose, Bld: 112 mg/dL — ABNORMAL HIGH (ref 70–99)
Potassium: 3.5 mmol/L (ref 3.5–5.1)
Sodium: 138 mmol/L (ref 135–145)
Total Bilirubin: 0.8 mg/dL (ref 0.0–1.2)
Total Protein: 6.4 g/dL — ABNORMAL LOW (ref 6.5–8.1)

## 2023-05-03 LAB — CBC
HCT: 36.9 % (ref 36.0–46.0)
Hemoglobin: 11.8 g/dL — ABNORMAL LOW (ref 12.0–15.0)
MCH: 34.4 pg — ABNORMAL HIGH (ref 26.0–34.0)
MCHC: 32 g/dL (ref 30.0–36.0)
MCV: 107.6 fL — ABNORMAL HIGH (ref 80.0–100.0)
Platelets: 199 10*3/uL (ref 150–400)
RBC: 3.43 MIL/uL — ABNORMAL LOW (ref 3.87–5.11)
RDW: 13.2 % (ref 11.5–15.5)
WBC: 6.8 10*3/uL (ref 4.0–10.5)
nRBC: 0 % (ref 0.0–0.2)

## 2023-05-03 LAB — URINALYSIS, W/ REFLEX TO CULTURE (INFECTION SUSPECTED)
Bacteria, UA: NONE SEEN
Bilirubin Urine: NEGATIVE
Glucose, UA: NEGATIVE mg/dL
Hgb urine dipstick: NEGATIVE
Ketones, ur: NEGATIVE mg/dL
Leukocytes,Ua: NEGATIVE
Nitrite: NEGATIVE
Protein, ur: NEGATIVE mg/dL
Specific Gravity, Urine: 1.015 (ref 1.005–1.030)
pH: 5 (ref 5.0–8.0)

## 2023-05-03 LAB — APTT: aPTT: 43 s — ABNORMAL HIGH (ref 24–36)

## 2023-05-03 LAB — ETHANOL: Alcohol, Ethyl (B): <10 mg/dL (ref <10)

## 2023-05-03 LAB — PROTIME-INR
INR: 1.1 (ref 0.8–1.2)
Prothrombin Time: 14.9 s (ref 11.4–15.2)

## 2023-05-03 LAB — URINE DRUG SCREEN, QUALITATIVE (ARMC ONLY)
Amphetamines, Ur Screen: NOT DETECTED
Barbiturates, Ur Screen: NOT DETECTED
Benzodiazepine, Ur Scrn: NOT DETECTED
Cannabinoid 50 Ng, Ur ~~LOC~~: POSITIVE — AB
Cocaine Metabolite,Ur ~~LOC~~: NOT DETECTED
MDMA (Ecstasy)Ur Screen: NOT DETECTED
Methadone Scn, Ur: NOT DETECTED
Opiate, Ur Screen: NOT DETECTED
Phencyclidine (PCP) Ur S: NOT DETECTED
Tricyclic, Ur Screen: NOT DETECTED

## 2023-05-03 NOTE — Consult Note (Signed)
 NEUROLOGY CONSULT NOTE   Date of service: May 03, 2023 Patient Name: Brenda Horton MRN:  [id number] DOB:  03/31/1929 Chief Complaint: Acute trouble speaking with memory loss Requesting Provider: Sharman Cheek, MD  History of Present Illness  Brenda Horton is a 89 y.o. female with a PMHx of CKD 3, HTN, macular degeneration and thyroid disease who presents to the ED with AMS. Son notes that she has been under significant emotional stress recently due to two recent deaths in the family with funerals. She was on her way to one of the funerals today, as a passenger in a vehicle, when son noticed that she had been strangely silent for the first hour of the car ride. When he tried to speak with her, her speech output was hesitant, as though trying to find the words, and she was also confused with altered memory of recent social interactions and the details of the funerals. The patient also complained of vertigo and stated "something is wrong". The patient does recall some of the car ride, again stating to examiner that she was dizzy and that she felt something was wrong, but could not further specify. Son does state that she also seemed to have some mild confusion starting yesterday. When she got to the ED, she had brief hallucinations stating that she could see many trees in the examination room. She is no longer experiencing hallucinations at the time of the neurology assessment.   LKW: 1200 Modified rankin score: 0-Completely asymptomatic and back to baseline post- stroke IV Thrombolysis:  No: Presentation and exam do not militate in favor of stroke. Although a small stroke is possible despite being low on the DDx, symptoms are too mild to treat.    NIHSS components Score: Comment  1a Level of Conscious 0[x]  1[]  2[]  3[]      1b LOC Questions 0[x]  1[]  2[]       1c LOC Commands 0[x]  1[]  2[]       2 Best Gaze 0[x]  1[]  2[]       3 Visual 0[x]  1[]  2[]  3[]      4 Facial Palsy 0[x]  1[]  2[]  3[]       5a Motor Arm - left 0[x]  1[]  2[]  3[]  4[]  UN[]    5b Motor Arm - Right 0[x]  1[]  2[]  3[]  4[]  UN[]    6a Motor Leg - Left 0[x]  1[]  2[]  3[]  4[]  UN[]    6b Motor Leg - Right 0[x]  1[]  2[]  3[]  4[]  UN[]    7 Limb Ataxia 0[x]  1[]  2[]  3[]  UN[]     8 Sensory 0[x]  1[]  2[]  UN[]      9 Best Language 0[x]  1[]  2[]  3[]      10 Dysarthria 0[x]  1[]  2[]  UN[]      11 Extinct. and Inattention 0[x]  1[]  2[]       TOTAL:   0      ROS  She has been eating and drinking normally and has not missed her medications. No recent illnesses, no fever, no SOB or cough, no dysuria and no pain complaints. Other ROS as per HPI.   Past History   Past Medical History:  Diagnosis Date   CKD (chronic kidney disease) stage 3, GFR 30-59 ml/min (HCC) 03/09/2018   Hypertension    Macular degeneration    Thyroid disease     Past Surgical History:  Procedure Laterality Date   ABDOMINAL HYSTERECTOMY     Partial   EYE SURGERY     FRACTURE SURGERY      Family History: Family History  Problem Relation Age of  Onset   Hyperthyroidism Mother    Hypertension Father    Hyperthyroidism Daughter    Arthritis Daughter    Rheum arthritis Daughter    Hyperthyroidism Son     Social History  reports that she has never smoked. She has never used smokeless tobacco. She reports current alcohol use. She reports that she does not use drugs.  No Known Allergies  Medications  No current facility-administered medications for this encounter.  Current Outpatient Medications:    Coenzyme Q10 10 MG capsule, Take 10 mg by mouth daily. , Disp: , Rfl:    colestipol (COLESTID) 1 g tablet, Take 4 tablets (4 g total) by mouth 2 (two) times daily., Disp: 240 tablet, Rfl: 2   diclofenac (VOLTAREN) 25 MG EC tablet, Take 1 tablet (25 mg total) by mouth 2 (two) times daily., Disp: 30 tablet, Rfl: 1   levothyroxine (SYNTHROID) 112 MCG tablet, Take 1 tablet (112 mcg total) by mouth daily before breakfast., Disp: 90 tablet, Rfl: 1    lisinopril-hydrochlorothiazide (ZESTORETIC) 20-12.5 MG tablet, Take 1 tablet by mouth daily., Disp: 90 tablet, Rfl: 1   Multiple Vitamin (MULTIVITAMIN) capsule, Take 1 capsule by mouth daily. , Disp: , Rfl:    Multiple Vitamins-Minerals (PRESERVISION AREDS 2) CAPS, Take 2 capsules by mouth daily. , Disp: , Rfl:    Probiotic Product (ALIGN) 4 MG CAPS, Take 4 mg by mouth daily., Disp: , Rfl:    psyllium (METAMUCIL) 58.6 % packet, Take 1 packet by mouth daily. , Disp: , Rfl:    RESTASIS 0.05 % ophthalmic emulsion, Place 1 drop into both eyes 2 (two) times daily. , Disp: , Rfl:   Vitals   Vitals:   05/06/2023 1410 05-06-2023 1510  BP: (!) 148/74 (!) 150/58  Pulse: (!) 54 (!) 52  Resp: 16 16  Temp: 97.6 F (36.4 C)   TempSrc: Oral   SpO2: 98% 100%  Weight: 61.7 kg   Height: 5' (1.524 m)     Body mass index is 26.56 kg/m.  Physical Exam   Physical Exam HEENT- [place]/AT   Lungs- Respirations unlabored Extremities- Warm and well-perfused  Neurological Examination Mental Status: Awake and alert. Fully oriented x 5. Thought content appropriate to all questions but then voice softens with decreased facial expressivity and decreased eye contact and engagement when asked for details about the two recent deaths in her family as well as funeral plans. Speech is fluent with intact naming, repetition and comprehension.  Able to follow all commands without difficulty. Cranial Nerves: II: Temporal visual fields intact with no extinction to DSS. PERRL. III,IV, VI: No ptosis. EOMI. No nystagmus. V: Temp sensation equal bilaterally VII: Smile symmetric VIII: Hearing intact to voice IX,X: No hypophonia or hoarseness XI: Symmetric XII: Midline tongue extension Motor: RUE: 5/5 LUE: 5/5 RLE: 5/5 LLE: 5/5 Sensory: Temp and FT intact x 4. No extinction to DSS. Deep Tendon Reflexes: 1+ and symmetric bilateral biceps, brachioradialis and patellae Cerebellar: No ataxia with FNF or H-S bilaterally Gait:  Deferred   Labs/Imaging/Neurodiagnostic studies   CBC:  Recent Labs  Lab 06-May-2023 1412  WBC 6.8  HGB 11.8*  HCT 36.9  MCV 107.6*  PLT 199   Basic Metabolic Panel:  Lab Results  Component Value Date   NA 138 May 06, 2023   K 3.5 May 06, 2023   CO2 24 05-06-2023   GLUCOSE 112 (H) 05/06/23   BUN 29 (H) May 06, 2023   CREATININE 1.01 (H) 05-06-2023   CALCIUM 9.2 May 06, 2023   GFRNONAA 53 (  L) 05/03/2023   GFRAA 51 (L) 01/05/2020   Lipid Panel:  Lab Results  Component Value Date   LDLCALC 104 (H) 08/03/2019   HgbA1c: No results found for: "HGBA1C" Urine Drug Screen:     Component Value Date/Time   LABOPIA NONE DETECTED 05/03/2023 1536   COCAINSCRNUR NONE DETECTED 05/03/2023 1536   LABBENZ NONE DETECTED 05/03/2023 1536   AMPHETMU NONE DETECTED 05/03/2023 1536   THCU POSITIVE (A) 05/03/2023 1536   LABBARB NONE DETECTED 05/03/2023 1536    Alcohol Level     Component Value Date/Time   ETH <10 05/03/2023 1543   INR  Lab Results  Component Value Date   INR 1.1 05/03/2023   APTT  Lab Results  Component Value Date   APTT 43 (H) 05/03/2023     ASSESSMENT  Sharry Beining is a 89 y.o. female presenting with acute trouble speaking with memory loss. - Exam is nonfocal.  - CT head: No acute intracranial process. Prominence of the lateral ventricles out of proportion to the degree of cerebral atrophy, which can be seen in normal pressure hydrocephalus. - Labs: Elevated BUN and Cr with eGFR of 53. Glucose 112. WBC 6.8. Mild anemia. THC is positive.  - DDx for her presentation includes toxic encephalopathy given positive THC on UDS. Given her age and demographics, it seems that THC use would be unusual and she should be interviewed further on usage patterns, as certainly a fragile 89 year old could be expected to have transient speech deficits, AMS and hallucinations with this substance. Metabolic encephalopathy is also on the DDx. A small stroke is possible but is felt to be  unlikely.   RECOMMENDATIONS  - MRI brain (ordered) - Interview patient regarding THC use patterns. Is this new due to the stress that she has been under? - Metabolic encephalopathy work up.  - IVF ______________________________________________________________________    Dessa Phi, Drew Herman, MD [facility]

## 2023-05-03 NOTE — ED Notes (Signed)
 First nurse note:  Helped get pt out of car. Daughter states pt has been confused. Pt does not have focal weakness. Face symmetrical, no arm drift noted. Daughter stated pt was c/o intermittent chest pain a few days ago but pt denies CP at this time.

## 2023-05-03 NOTE — ED Notes (Signed)
 Pt in MRI.

## 2023-05-03 NOTE — ED Notes (Signed)
 Pt stating feeling better, response to questions has improved. Family stating they can tell a difference.

## 2023-05-03 NOTE — ED Triage Notes (Signed)
 Pt son reports pt being altered and c/o "something happening to her" Pt son reports she was not herself yesterday either, LKW was possibly Wednesday. Pt c/o dizziness and is hallucinating in triage

## 2023-05-03 NOTE — ED Notes (Signed)
 Dr. Otelia Limes at bedside.

## 2023-05-03 NOTE — ED Provider Notes (Signed)
 [facility] Provider Note    None    (approximate)   History   Chief Complaint: Near Syncope and Altered Mental Status   HPI  Brenda Horton is a 89 y.o. female with a history of hypertension, CKD who comes to the ED due to altered mental status.  Son notes the patient seemed to have some confusion that was mild starting yesterday, but today around noon while they were driving in the car to a funeral, the patient reported that she suddenly felt dizzy and that "something was wrong."  She seemed to be having trouble with language understanding, and described visual hallucinations.  Normal patient is awake alert oriented x 3, good executive function.  She has been eating and drinking normally, taking her medications.  No falls or head trauma, no fever, no pain, no cough shortness of breath or dysuria.  Symptoms currently are improved but still not back to baseline.          Physical Exam   Triage Vital Signs: ED Triage Vitals [05/03/23 1410]  Encounter Vitals Group     BP (!) 148/74     Systolic BP Percentile      Diastolic BP Percentile      Pulse Rate (!) 54     Resp 16     Temp 97.6 F (36.4 C)     Temp Source Oral     SpO2 98 %     Weight 136 lb (61.7 kg)     Height 5' (1.524 m)     Head Circumference      Peak Flow      Pain Score 0     Pain Loc      Pain Education      Exclude from Growth Chart     Most recent vital signs: Vitals:   05/03/23 2000 05/03/23 2030  BP: (!) 162/55 (!) 125/41  Pulse: (!) 49 (!) 55  Resp: 14 17  Temp:    SpO2:      General: Awake, no distress.  CV:  Good peripheral perfusion.  Bradycardia, heart rate 50.  Normal distal pulses Resp:  Normal effort.  Clear to auscultation bilaterally Abd:  No distention.  Soft nontender Other:  Cranial nerves III through XII intact.  Motor function normal, sensation normal.  Speech and language currently normal.  Finger-to-nose demonstrates some very mild impairment  on the left relative to the right.  Heel-to-shin similarly demonstrates mild impairment of the left compared to the right.  Patient is reticent with language but seems to express herself appropriately   ED Results / Procedures / Treatments   Labs (all labs ordered are listed, but only abnormal results are displayed) Labs Reviewed  COMPREHENSIVE METABOLIC PANEL - Abnormal; Notable for the following components:      Result Value   Glucose, Bld 112 (*)    BUN 29 (*)    Creatinine, Ser 1.01 (*)    Total Protein 6.4 (*)    Alkaline Phosphatase 25 (*)    GFR, Estimated 53 (*)    All other components within normal limits  CBC - Abnormal; Notable for the following components:   RBC 3.43 (*)    Hemoglobin 11.8 (*)    MCV 107.6 (*)    MCH 34.4 (*)    All other components within normal limits  URINALYSIS, W/ REFLEX TO CULTURE (INFECTION SUSPECTED) - Abnormal; Notable for the following components:   Color, Urine YELLOW (*)  APPearance HAZY (*)    All other components within normal limits  APTT - Abnormal; Notable for the following components:   aPTT 43 (*)    All other components within normal limits  URINE DRUG SCREEN, QUALITATIVE ([facility] ONLY) - Abnormal; Notable for the following components:   Cannabinoid 50 Ng, Ur [place] POSITIVE (*)    All other components within normal limits  ETHANOL  PROTIME-INR     EKG Interpreted by me Sinus bradycardia rate of 50.  Normal axis intervals QRS ST segments and T waves   RADIOLOGY CT head interpreted by me, no acute findings.  Radiology report reviewed   PROCEDURES:  Procedures   MEDICATIONS ORDERED IN ED: Medications - No data to display   IMPRESSION / MDM / ASSESSMENT AND PLAN / ED COURSE  I reviewed the triage vital signs and the nursing notes.  DDx: Intracranial hemorrhage, ischemic stroke, dehydration, electrolyte derangement, AKI, anemia, intoxication, vagal episode  Patient's presentation is most consistent with acute  presentation with potential threat to life or bodily function.    Clinical Course as of 05/03/23 2036  Sat May 03, 2023  1532 Pt p/w sudden dizziness, aphasia with wax/waning sx that started around 12:00 today per family. Currently has minor limb ataxia in LUE and LLE along with mild aphasia, NIHSS 3. Will active code stroke eval. She also reports visual halluc. Which may be metabolic. Serum labs okay, will check UA. [PS]  1607 Presentation d/w neuro Dr. Otelia Limes. CVA doubtful, recs MRI to further assess [PS]  2005 MRI negative. Stable for DC. Possible THC effect [PS]    Clinical Course User Index [PS] Sharman Cheek, MD     FINAL CLINICAL IMPRESSION(S) / ED DIAGNOSES   Final diagnoses:  Altered mental status, unspecified altered mental status type     Rx / DC Orders   ED Discharge Orders     None        Note:  This document was prepared using Dragon voice recognition software and may include unintentional dictation errors.   Sharman Cheek, MD 05/03/23 2036

## 2023-05-03 NOTE — ED Notes (Signed)
 Code Stroke was called to [facility] at 3:30 pm.

## 2024-03-30 ENCOUNTER — Other Ambulatory Visit: Payer: Self-pay

## 2024-03-30 ENCOUNTER — Emergency Department

## 2024-03-30 ENCOUNTER — Encounter: Payer: Self-pay | Admitting: *Deleted

## 2024-03-30 ENCOUNTER — Observation Stay: Admission: EM | Admit: 2024-03-30 | Discharge: 2024-04-02 | Disposition: A | Discharge status: Home / Self Care

## 2024-03-30 DIAGNOSIS — R42 Dizziness and giddiness: Secondary | ICD-10-CM

## 2024-03-30 DIAGNOSIS — I4891 Unspecified atrial fibrillation: Principal | ICD-10-CM | POA: Diagnosis present

## 2024-03-30 DIAGNOSIS — I129 Hypertensive chronic kidney disease with stage 1 through stage 4 chronic kidney disease, or unspecified chronic kidney disease: Secondary | ICD-10-CM | POA: Insufficient documentation

## 2024-03-30 DIAGNOSIS — R001 Bradycardia, unspecified: Secondary | ICD-10-CM | POA: Diagnosis not present

## 2024-03-30 DIAGNOSIS — R54 Age-related physical debility: Secondary | ICD-10-CM | POA: Diagnosis not present

## 2024-03-30 DIAGNOSIS — Z79899 Other long term (current) drug therapy: Secondary | ICD-10-CM | POA: Diagnosis not present

## 2024-03-30 DIAGNOSIS — I951 Orthostatic hypotension: Secondary | ICD-10-CM | POA: Insufficient documentation

## 2024-03-30 DIAGNOSIS — E039 Hypothyroidism, unspecified: Secondary | ICD-10-CM | POA: Insufficient documentation

## 2024-03-30 DIAGNOSIS — N183 Chronic kidney disease, stage 3 unspecified: Secondary | ICD-10-CM | POA: Insufficient documentation

## 2024-03-30 DIAGNOSIS — I10 Essential (primary) hypertension: Secondary | ICD-10-CM | POA: Diagnosis present

## 2024-03-30 DIAGNOSIS — I480 Paroxysmal atrial fibrillation: Secondary | ICD-10-CM | POA: Diagnosis not present

## 2024-03-30 LAB — CBC
HCT: 34.4 % — ABNORMAL LOW (ref 36.0–46.0)
Hemoglobin: 11.2 g/dL — ABNORMAL LOW (ref 12.0–15.0)
MCH: 34 pg (ref 26.0–34.0)
MCHC: 32.6 g/dL (ref 30.0–36.0)
MCV: 104.6 fL — ABNORMAL HIGH (ref 80.0–100.0)
Platelets: 216 K/uL (ref 150–400)
RBC: 3.29 MIL/uL — ABNORMAL LOW (ref 3.87–5.11)
RDW: 14 % (ref 11.5–15.5)
WBC: 6 K/uL (ref 4.0–10.5)
nRBC: 0 % (ref 0.0–0.2)

## 2024-03-30 LAB — BASIC METABOLIC PANEL WITH GFR
Anion gap: 11 (ref 5–15)
BUN: 17 mg/dL (ref 8–23)
CO2: 25 mmol/L (ref 22–32)
Calcium: 9.2 mg/dL (ref 8.9–10.3)
Chloride: 109 mmol/L (ref 98–111)
Creatinine, Ser: 0.8 mg/dL (ref 0.44–1.00)
GFR, Estimated: >60 mL/min
Glucose, Bld: 92 mg/dL (ref 70–99)
Potassium: 3.5 mmol/L (ref 3.5–5.1)
Sodium: 145 mmol/L (ref 135–145)

## 2024-03-30 LAB — URINALYSIS, ROUTINE W REFLEX MICROSCOPIC
Bilirubin Urine: NEGATIVE
Glucose, UA: NEGATIVE mg/dL
Hgb urine dipstick: NEGATIVE
Ketones, ur: NEGATIVE mg/dL
Leukocytes,Ua: NEGATIVE
Nitrite: NEGATIVE
Protein, ur: NEGATIVE mg/dL
Specific Gravity, Urine: 1.01 (ref 1.005–1.030)
pH: 5 (ref 5.0–8.0)

## 2024-03-30 LAB — MAGNESIUM: Magnesium: 1.9 mg/dL (ref 1.7–2.4)

## 2024-03-30 LAB — TROPONIN T, HIGH SENSITIVITY
Troponin T High Sensitivity: 23 ng/L — ABNORMAL HIGH (ref 0–19)
Troponin T High Sensitivity: 21 ng/L — ABNORMAL HIGH (ref 0–19)

## 2024-03-30 MED ORDER — METOPROLOL TARTRATE 25 MG PO TABS
12.5000 mg | ORAL_TABLET | Freq: Once | ORAL | Status: AC
  Administered 2024-03-30: 12.5 mg via ORAL

## 2024-03-30 MED ORDER — METOPROLOL TARTRATE 25 MG PO TABS
25.0000 mg | ORAL_TABLET | Freq: Once | ORAL | Status: DC
  Filled 2024-03-30: qty 1

## 2024-03-30 NOTE — ED Triage Notes (Addendum)
 Pt to triage via wheelchair.  Pt reports dizziness and near syncope at 1300 today.  Pt reports chest tightness.  No sob.  No n/v/  pt alert  speech clear.  Afib on EKG.  No hx afib per pt   Pt reports rapid heart rate and palpitations today.

## 2024-03-30 NOTE — ED Notes (Signed)
 Patient ambulated with this RN at side without issue to toilet. Patient denies dizziness however finds comfort in support of RN assisting her. Patient reporting feeling weak all over. Back to bed without issue.

## 2024-03-30 NOTE — ED Provider Notes (Signed)
 "  [facility] Provider Note    Event Date/Time   First MD Initiated Contact with Patient 03/30/24 1831     (approximate)   History   Dizziness and Near Syncope   HPI  Yumalay Circle is a 89 y.o. female who presents to the ED for evaluation of Dizziness and Near Syncope   I review a routine PCP visit from 2 weeks ago.  HTN, HLD and CKD 3.  Patient presents to the ED with her son from home for evaluation of intermittent dizziness, palpitations and near syncope.  Patient lives at home independently, handles her own ADLs, ambulates independently, son lives nearby.  Patient reports intermittent episodes over the past few weeks of this.  No syncope.  Did have a fall today and she denies any injury   Physical Exam   Triage Vital Signs: ED Triage Vitals  Encounter Vitals Group     BP 03/30/24 1604 (!) 136/47     Girls Systolic BP Percentile --      Girls Diastolic BP Percentile --      Boys Systolic BP Percentile --      Boys Diastolic BP Percentile --      Pulse Rate 03/30/24 1604 (!) 102     Resp 03/30/24 1604 20     Temp 03/30/24 1604 98.2 F (36.8 C)     Temp Source 03/30/24 1604 Oral     SpO2 03/30/24 1604 97 %     Weight 03/30/24 1557 137 lb (62.1 kg)     Height 03/30/24 1557 5' (1.524 m)     Head Circumference --      Peak Flow --      Pain Score 03/30/24 1557 5     Pain Loc --      Pain Education --      Exclude from Growth Chart --     Most recent vital signs: Vitals:   03/30/24 2130 03/30/24 2200  BP: (!) 146/50 (!) 145/46  Pulse: 64 (!) 54  Resp: (!) 24 14  Temp:    SpO2: 99% 98%    General: Awake, no distress.  Well-appearing and conversational CV:  Good peripheral perfusion.  RRR, NSR, during my initial evaluation. Resp:  Normal effort.  Abd:  No distention.  Soft MSK:  No deformity noted.  Lower extremity edema, right greater than left Neuro:  No focal deficits appreciated. Other:     ED Results / Procedures /  Treatments   Labs (all labs ordered are listed, but only abnormal results are displayed) Labs Reviewed  CBC - Abnormal; Notable for the following components:      Result Value   RBC 3.29 (*)    Hemoglobin 11.2 (*)    HCT 34.4 (*)    MCV 104.6 (*)    All other components within normal limits  URINALYSIS, ROUTINE W REFLEX MICROSCOPIC - Abnormal; Notable for the following components:   Color, Urine YELLOW (*)    APPearance CLEAR (*)    All other components within normal limits  TROPONIN T, HIGH SENSITIVITY - Abnormal; Notable for the following components:   Troponin T High Sensitivity 23 (*)    All other components within normal limits  TROPONIN T, HIGH SENSITIVITY - Abnormal; Notable for the following components:   Troponin T High Sensitivity 21 (*)    All other components within normal limits  BASIC METABOLIC PANEL WITH GFR  MAGNESIUM     EKG Rapid A-fib with a rate of  111 bpm, normal axis, no STEMI  RADIOLOGY CXR interpreted by me without evidence of acute cardiopulmonary pathology. Ultrasound the bilateral lower extremities interpreted by me without signs of DVT  Official radiology report(s): US  Venous Img Lower Bilateral Result Date: 03/30/2024 EXAM: ULTRASOUND DUPLEX OF THE BILATERAL LOWER EXTREMITY VEINS TECHNIQUE: Duplex ultrasound using B-mode/gray scaled imaging and Doppler spectral analysis and color flow was obtained of the deep venous structures of the bilateral lower extremity. COMPARISON: None available. CLINICAL HISTORY: R > L new swelling, warmth. eval DVT FINDINGS: RIGHT: The common femoral vein, femoral vein, popliteal vein, and posterior tibial vein demonstrate normal compressibility with normal color flow and spectral analysis. 3.7 x 1.3 x 4.0 cm right popliteal fossa cyst. LEFT: The common femoral vein, femoral vein, popliteal vein, and posterior tibial vein demonstrate normal compressibility with normal color flow and spectral analysis. IMPRESSION: 1. No deep  venous thrombosis in the bilateral lower extremity. 2. 3.7 x 1.3 x 4.0 cm right popliteal fossa cyst. Electronically signed by: Pinkie Pebbles MD 03/30/2024 11:00 PM EST RP Workstation: [id number]   DG Chest 2 View Result Date: 03/30/2024 EXAM: 2 VIEW(S) XRAY OF THE CHEST 03/30/2024 05:17:00 PM COMPARISON: None available. CLINICAL HISTORY: Chest pain Chest pain chest pain chest pain FINDINGS: LUNGS AND PLEURA: No focal pulmonary opacity. No pleural effusion. No pneumothorax. HEART AND MEDIASTINUM: No acute abnormality of the cardiac and mediastinal silhouettes. BONES AND SOFT TISSUES: No acute osseous abnormality. IMPRESSION: 1. No acute process. Electronically signed by: Franky Crease MD 03/30/2024 05:19 PM EST RP Workstation: [id number]    PROCEDURES and INTERVENTIONS:  .1-3 Lead EKG Interpretation  Performed by: Claudene Rover, MD Authorized by: Claudene Rover, MD     Interpretation: normal     ECG rate:  80   ECG rate assessment: normal     Rhythm: sinus rhythm     Ectopy: none     Conduction: normal     Medications  metoprolol  tartrate (LOPRESSOR ) tablet 12.5 mg (12.5 mg Oral Given 03/30/24 1954)     IMPRESSION / MDM / ASSESSMENT AND PLAN / ED COURSE  I reviewed the triage vital signs and the nursing notes.  Differential diagnosis includes, but is not limited to, A-fib, A-fib with RVR or other cardiac dysrhythmia, vasovagal episodes, orthostasis, TIA, stroke, trauma  {Patient presents with symptoms of an acute illness or injury that is potentially life-threatening.  Patient presents from home with new onset atrial fibrillation.  Intermittent dizziness and palpitations, captured on triage EKG with rapid A-fib.  Waxing and waning symptoms.  Provide small dose of oral metoprolol  tartrate and these abate.  She does have sinus bradycardia and relatively low diastolic BP.  With ambulation trial around the room she is still developing orthostasis and dizziness.  Blood work is reassuring  with normal electrolytes, renal function, CBC.  UA without infectious features, has some asymmetric lower extremity swelling and a negative venous ultrasound for DVT.  Due to her vital sign derangements, continued orthostasis and the fact that she lives at home by herself I will consult medicine for admission.  Clinical Course as of 03/30/24 2313  Tue Mar 30, 2024  2121 Reassessed, patient up and ambulating in the room with nursing staff, fairly slow and unsteady.  Low diastolic BP, maps are borderline high 60s low 70s. [DS]    Clinical Course User Index [DS] Claudene Rover, MD     FINAL CLINICAL IMPRESSION(S) / ED DIAGNOSES   Final diagnoses:  New onset atrial fibrillation (HCC)  Rx / DC Orders   ED Discharge Orders     None        Note:  This document was prepared using Dragon voice recognition software and may include unintentional dictation errors.   Claudene Rover, MD 03/30/24 2315  "

## 2024-03-31 ENCOUNTER — Other Ambulatory Visit: Payer: Self-pay

## 2024-03-31 ENCOUNTER — Observation Stay

## 2024-03-31 DIAGNOSIS — I4891 Unspecified atrial fibrillation: Secondary | ICD-10-CM

## 2024-03-31 DIAGNOSIS — R42 Dizziness and giddiness: Secondary | ICD-10-CM

## 2024-03-31 DIAGNOSIS — I951 Orthostatic hypotension: Secondary | ICD-10-CM

## 2024-03-31 DIAGNOSIS — I10 Essential (primary) hypertension: Secondary | ICD-10-CM

## 2024-03-31 DIAGNOSIS — E039 Hypothyroidism, unspecified: Secondary | ICD-10-CM

## 2024-03-31 DIAGNOSIS — R001 Bradycardia, unspecified: Secondary | ICD-10-CM

## 2024-03-31 DIAGNOSIS — R54 Age-related physical debility: Secondary | ICD-10-CM | POA: Insufficient documentation

## 2024-03-31 LAB — CBG MONITORING, ED: Glucose-Capillary: 88 mg/dL (ref 70–99)

## 2024-03-31 MED ORDER — ENOXAPARIN SODIUM 40 MG/0.4ML IJ SOSY
40.0000 mg | PREFILLED_SYRINGE | INTRAMUSCULAR | Status: DC
  Filled 2024-03-31: qty 0.4

## 2024-03-31 MED ORDER — ACETAMINOPHEN 325 MG PO TABS: 650.0000 mg | ORAL_TABLET | Freq: Four times a day (QID) | ORAL | Status: DC | PRN

## 2024-03-31 MED ORDER — PROSIGHT PO TABS
1.0000 | ORAL_TABLET | Freq: Every day | ORAL | Status: DC
  Administered 2024-03-31 – 2024-04-02 (×3): 1 via ORAL
  Filled 2024-03-31 (×3): qty 1

## 2024-03-31 MED ORDER — SODIUM CHLORIDE 0.9% FLUSH
3.0000 mL | Freq: Two times a day (BID) | INTRAVENOUS | Status: DC
  Administered 2024-03-31 – 2024-04-02 (×5): 3 mL via INTRAVENOUS

## 2024-03-31 MED ORDER — LEVOTHYROXINE SODIUM 50 MCG PO TABS
100.0000 ug | ORAL_TABLET | Freq: Every day | ORAL | Status: DC
  Administered 2024-03-31 – 2024-04-02 (×3): 100 ug via ORAL
  Filled 2024-03-31 (×3): qty 2

## 2024-03-31 MED ORDER — ONDANSETRON HCL 4 MG/2ML IJ SOLN: 4.0000 mg | Freq: Four times a day (QID) | INTRAMUSCULAR | Status: DC | PRN

## 2024-03-31 MED ORDER — ONDANSETRON HCL 4 MG PO TABS: 4.0000 mg | ORAL_TABLET | Freq: Four times a day (QID) | ORAL | Status: DC | PRN

## 2024-03-31 MED ORDER — APIXABAN 2.5 MG PO TABS
2.5000 mg | ORAL_TABLET | Freq: Two times a day (BID) | ORAL | Status: DC
  Administered 2024-03-31 – 2024-04-02 (×5): 2.5 mg via ORAL
  Filled 2024-03-31 (×6): qty 1

## 2024-03-31 MED ORDER — SODIUM CHLORIDE 0.9 % IV SOLN: INTRAVENOUS | Status: AC

## 2024-03-31 MED ORDER — ACETAMINOPHEN 650 MG RE SUPP: 650.0000 mg | Freq: Four times a day (QID) | RECTAL | Status: DC | PRN

## 2024-03-31 NOTE — ED Notes (Signed)
 Pt assisted to restroom. Pt able to ambulate with x1 assistance, slow and stead gait. Pt assisted back to bed, no further needs at this time. Daughter remains at bedside.

## 2024-03-31 NOTE — Assessment & Plan Note (Addendum)
 Symptomatic sinus bradycardia, possibly related to beta-blocker Patient presents with palpitations dizziness, found to be in new onset A-fib, rate 110 that  spontaneously converted to sinus bradycardia Patient was started on bisoprolol on 1/7 following her first episode of dizziness IV fluid bolus Hold bisoprolol Continuous cardiac monitoring Echocardiogram-pending Brain MRI normal Cardiology consult Fall precautions Will defer to cardiology for recommendations for anticoagulation

## 2024-03-31 NOTE — Progress Notes (Signed)
 " Progress Note   Patient: Brenda Horton FMW:[id number] DOB: 1929/12/29 DOA: 03/30/2024     0 DOS: the patient was seen and examined on 03/31/2024   Brief hospital course: Partly taken from H&P.  Joyceann Kruser is a 89 y.o. female with medical history significant for Hypertension and hypothyroidism who is functionally independent and lives alone being admitted new onset paroxysmal A-fib with RVR. She presented with 2 weeks of episodic dizziness and palpitations, resulting in a fall on the day of arrival.  On presentation she was found to be in A-fib with RVR, spontaneously converted back to sinus bradycardia in mid 50s.  Positive orthostatic vitals. She did receive a small dose of oral metoprolol  of 12.5 mg.  Remained bradycardic and symptomatic for weakness.  Labs stable except mildly elevated troponin at 21, potassium 3.5 and magnesium  1.9. CXR was negative for any acute abnormality, lower extremity venous Doppler negative for DVT but did show a right popliteal fossa cyst.  Echocardiogram ordered and cardiology was consulted.  1/21: Vital stable with heart rate at 50.  MRI brain was negative for any acute abnormality.  Orthostatic vitals negative.  Remained in sinus rhythm with heart rate in low 50s.  PT recommending home health which was ordered. Echocardiogram pending.    Assessment and Plan: New onset paroxysmal atrial fibrillation with RVR (HCC) Symptomatic sinus bradycardia, possibly related to beta-blocker Patient presents with palpitations dizziness, found to be in new onset A-fib, rate 110 that  spontaneously converted to sinus bradycardia Patient was started on bisoprolol on 1/7 following her first episode of dizziness IV fluid bolus Hold bisoprolol Continuous cardiac monitoring Echocardiogram-pending Brain MRI normal Cardiology consult Fall precautions Will defer to cardiology for recommendations for anticoagulation  Orthostatic hypotension Repeat orthostatic vitals were  negative this morning.  Vertigo Possibly BPPV however in the setting of new onset A-fib,  PT OT eval-recommending home health MRI negative  Hypertension Blood pressure currently within goal. -Holding home HCTZ and lisinopril   Hypothyroidism Continue levothyroxine   Advanced age Macular degeneration Supportive care   Subjective: Patient was seen and examined in ED today.  She was still feeling weak, no chest pain, palpitation or shortness of breath.  Physical Exam: Vitals:   03/31/24 0630 03/31/24 0815 03/31/24 1019 03/31/24 1246  BP: (!) 130/49  (!) 127/41   Pulse: (!) 50  (!) 48   Resp: 17  16   Temp:  98.5 F (36.9 C)  98.6 F (37 C)  TempSrc:  Oral  Oral  SpO2: 98%  94%   Weight:      Height:       General.  Frail elderly lady, in no acute distress. Pulmonary.  Lungs clear bilaterally, normal respiratory effort. CV.  Regular rate and rhythm, no JVD, rub or murmur. Abdomen.  Soft, nontender, nondistended, BS positive. CNS.  Alert and oriented .  No focal neurologic deficit. Extremities.  No edema,  pulses intact and symmetrical. Psychiatry.  Judgment and insight appears normal.   Data Reviewed: Prior data reviewed  Family Communication: Discussed with daughter at bedside  Disposition: Status is: Observation The patient remains OBS appropriate and will d/c before 2 midnights.  Planned Discharge Destination: Home with Home Health  DVT prophylaxis.  Eliquis  Time spent:  minutes  This record has been created using Conservation officer, historic buildings. Errors have been sought and corrected,but may not always be located. Such creation errors do not reflect on the standard of care.   Author: Amaryllis Dare, MD 03/31/2024 2:29 PM  For on call review [email or web address].  "

## 2024-03-31 NOTE — Assessment & Plan Note (Signed)
 Macular degeneration Supportive care

## 2024-03-31 NOTE — Evaluation (Signed)
 Occupational Therapy Evaluation Patient Details Name: Brenda Horton MRN: [id number] DOB: 10-14-1929 Today's Date: 03/31/2024   History of Present Illness   Pt is a 89 y/o F admitted on 03/30/24 after presenting with c/o episodic dizziness & palpitations x 2 weeks. Pt is being treated for new onset paroxysmal a-fib with RVR, symptomatic sinus bradycardia. PMH: HTN, hypothyroidism, macular degeneration, CKD 3     Clinical Impressions Patient was seen for OT evaluation this date. Prior to hospital admission, patient was living alone and independent. Patient performs sit<>stand from recliner with min A; ambulated ~60 feet with rolling walker before reporting fatigue and requested to lay back down; required min A to lift BLE into bed and move up in bed. Education provided on fall prevention strategies including maintaining hydration, patient receptive. Son present and reports he can provide 24/7 for first 3-5 days upon discharge. Patient presents with deficits in overall activity tolerance, standing tolerance and gross strength, affecting safe and optimal ADL completion. Patient is currently requiring CGA for ADLs.  Paient would benefit from skilled OT services to address noted impairments and functional limitations (see below for any additional details) in order to maximize safety and independence while minimizing future risk of falls, injury, and readmission. Anticipate the need for follow up OT services upon acute hospital DC.      If plan is discharge home, recommend the following:   A little help with walking and/or transfers;A little help with bathing/dressing/bathroom;Assistance with cooking/housework;Assist for transportation     Functional Status Assessment   Patient has had a recent decline in their functional status and demonstrates the ability to make significant improvements in function in a reasonable and predictable amount of time.     Equipment Recommendations   None  recommended by OT     Recommendations for Other Services         Precautions/Restrictions   Precautions Precautions: Fall Recall of Precautions/Restrictions: Intact Restrictions Weight Bearing Restrictions Per Provider Order: No     Mobility Bed Mobility Overal bed mobility: Needs Assistance Bed Mobility: Sit to Supine     Supine to sit: Contact guard          Transfers Overall transfer level: Needs assistance Equipment used: Rolling walker (2 wheels) Transfers: Sit to/from Stand Sit to Stand: Min assist                  Balance Overall balance assessment: Needs assistance Sitting-balance support: No upper extremity supported, Feet supported Sitting balance-Leahy Scale: Good     Standing balance support: Bilateral upper extremity supported, Reliant on assistive device for balance, During functional activity Standing balance-Leahy Scale: Fair                             ADL either performed or assessed with clinical judgement   ADL Overall ADL's : Needs assistance/impaired                                       General ADL Comments: patient requires supervision/SBA for ADLs from seated position; CGA while in stance     Vision         Perception         Praxis         Pertinent Vitals/Pain Pain Assessment Pain Assessment: No/denies pain     Extremity/Trunk Assessment Upper Extremity Assessment Upper Extremity  Assessment: Overall WFL for tasks assessed   Lower Extremity Assessment Lower Extremity Assessment: Overall WFL for tasks assessed       Communication Communication Communication: No apparent difficulties   Cognition Arousal: Alert Behavior During Therapy: WFL for tasks assessed/performed Cognition: No apparent impairments                               Following commands: Intact       Cueing  General Comments   Cueing Techniques: Verbal cues      Exercises      Shoulder Instructions      Home Living Family/patient expects to be discharged to:: Private residence Living Arrangements: Alone Available Help at Discharge: Family;Available PRN/intermittently Type of Home: House Home Access: Level entry     Home Layout: One level     Bathroom Shower/Tub: Producer, Television/film/video: Handicapped height Bathroom Accessibility: Yes How Accessible: Accessible via walker Home Equipment: Shower seat - built in          Prior Functioning/Environment Prior Level of Function : Independent/Modified Independent             Mobility Comments: ambulatory without AD, only 1 fall prior to admission 2/2 episode that lead to admission, does not drive (family provides transportation) ADLs Comments: independent with bathing, dressing, cooking, cleaning, son asking for resources/home health aide to assist them, family double checks med management    OT Problem List: Decreased strength;Decreased activity tolerance;Impaired balance (sitting and/or standing)   OT Treatment/Interventions: Self-care/ADL training;Energy conservation;DME and/or AE instruction;Balance training;Patient/family education      OT Goals(Current goals can be found in the care plan section)   Acute Rehab OT Goals Patient Stated Goal: to go home OT Goal Formulation: With patient Time For Goal Achievement: 04/14/24 Potential to Achieve Goals: Good ADL Goals Pt Will Perform Grooming: with supervision;standing Pt Will Perform Lower Body Dressing: sit to/from stand;with supervision Pt Will Transfer to Toilet: with supervision;regular height toilet Pt Will Perform Toileting - Clothing Manipulation and hygiene: with supervision;sit to/from stand;with contact guard assist   OT Frequency:  Min 2X/week    Co-evaluation              AM-PAC OT 6 Clicks Daily Activity     Outcome Measure Help from another person eating meals?: None Help from another person taking care  of personal grooming?: A Little Help from another person toileting, which includes using toliet, bedpan, or urinal?: A Little Help from another person bathing (including washing, rinsing, drying)?: A Little Help from another person to put on and taking off regular upper body clothing?: A Little Help from another person to put on and taking off regular lower body clothing?: A Little 6 Click Score: 19   End of Session    Activity Tolerance:   Patient left:    OT Visit Diagnosis: Unsteadiness on feet (R26.81);Other abnormalities of gait and mobility (R26.89);History of falling (Z91.81);Muscle weakness (generalized) (M62.81)                Time: 8568-8545 OT Time Calculation (min): 23 min Charges:  OT General Charges $OT Visit: 1 Visit OT Evaluation $OT Eval Low Complexity: 1 Low  Rogers Clause, OT/L MSOT, 03/31/2024

## 2024-03-31 NOTE — ED Notes (Signed)
 Pt to MRI

## 2024-03-31 NOTE — Assessment & Plan Note (Signed)
 Continue levothyroxine 

## 2024-03-31 NOTE — TOC Initial Note (Signed)
 Transition of Care [facility]) - Initial/Assessment Note    Patient Details  Name: Brenda Horton MRN: [id number] Date of Birth: Dec 10, 1929  Transition of Care [facility]) CM/SW Contact:    Neev Mcmains L Elfrieda Espino, LCSW Phone Number: 03/31/2024, 4:18 PM  Clinical Narrative:                  CSW spoke with Georgette FISHER. CSW provided options for home care agencies. Doug advised that he needed someone to come into the home and ensure that patient takes her medication. CSW advised that it is not likely that patient would get a RN daily to administer medication. CSW advised that private care duty may be what he is looking for.   Doug asked CSW to e-mail him a list of Home Health agencies for him to follow up with and ask about available services. CSW e-mailed a list to [email or web address].          Patient Goals and CMS Choice            Expected Discharge Plan and Services                                              Prior Living Arrangements/Services                       Activities of Daily Living      Permission Sought/Granted                  Emotional Assessment              Admission diagnosis:  New onset atrial fibrillation [facility]) [I48.91] Patient Active Problem List   Diagnosis Date Noted   Advanced age 89/21/2026   New onset paroxysmal atrial fibrillation with RVR (HCC) 03/30/2024   Vertigo 03/30/2024   Orthostatic hypotension 03/30/2024   Sinus bradycardia / tachybradysyndrome 03/30/2024   Callus 04/27/2020   Prominent metatarsal head of left foot 04/27/2020   Pincer nail deformity 05/06/2019   Hav (hallux abducto valgus), right 11/23/2018   Colon polyps 08/24/2018   Pain due to onychomycosis of toenails of both feet 08/24/2018   Plantar fasciitis, left 08/24/2018   Hypertension 03/09/2018   Hypothyroidism 03/09/2018   Diarrhea due to malabsorption 11/02/2015   Osteopenia 10/22/2013   PCP:  [facility] Pharmacy:    [facility] [phone] [address], [place] - [address] [place] [address] Phone: [phone] Fax: [phone]  [facility] [place], [place] - [address] [facility] AT Portal to [facility] [address] [place] [address] Phone: [phone] Fax: [phone]     Social Drivers of Health (SDOH) Social History: SDOH Screenings   Food Insecurity: No Food Insecurity (03/16/2024)   Received from [facility]  Housing: Low Risk  (03/16/2024)   Received from [facility]  Transportation Needs: No Transportation Needs (03/16/2024)   Received from [facility]  Utilities: Not At Risk (03/16/2024)   Received from [facility]  Financial Resource Strain: Low Risk  (03/16/2024)   Received from [facility]  Tobacco Use: Low Risk (03/30/2024)   SDOH Interventions:     Readmission Risk Interventions     No data to display

## 2024-03-31 NOTE — Assessment & Plan Note (Signed)
 Repeat orthostatic vitals were negative this morning.

## 2024-03-31 NOTE — Assessment & Plan Note (Addendum)
 Possibly BPPV however in the setting of new onset A-fib,  PT OT eval-recommending home health MRI negative

## 2024-03-31 NOTE — Evaluation (Signed)
 Physical Therapy Evaluation Patient Details Name: Brenda Horton MRN: [id number] DOB: 1929/11/25 Today's Date: 03/31/2024  History of Present Illness  Pt is a 89 y/o F admitted on 03/30/24 after presenting with c/o episodic dizziness & palpitations x 2 weeks. Pt is being treated for new onset paroxysmal a-fib with RVR, symptomatic sinus bradycardia. PMH: HTN, hypothyroidism, macular degeneration, CKD 3  Clinical Impression  Pt seen for PT evaluation with pt agreeable, son present. Prior to admission pt was independent without AD, living alone with family assisting PRN & with transportation. On this date, pt is able to complete bed mobility with supervision, ambulate in hallway with RW & CGA. Pt requires multiple pauses/standing rest breaks during gait, does c/o lightheadedness at end. Recommend ongoing PT services to progress mobility with LRAD.     BP checked in LUE  BP  Supine  121/44 mmHg MAP 67  Sitting 133/99 mmHg MAP 111  Standing at 0 143/48 mmHg MAP 77  Sitting after gait 125/62 mmHg MAP 82        If plan is discharge home, recommend the following: A little help with walking and/or transfers;A little help with bathing/dressing/bathroom;Assistance with cooking/housework;Assist for transportation;Direct supervision/assist for medications management;Help with stairs or ramp for entrance   Can travel by private vehicle        Equipment Recommendations Rolling walker (2 wheels);BSC/3in1  Recommendations for Other Services       Functional Status Assessment Patient has had a recent decline in their functional status and demonstrates the ability to make significant improvements in function in a reasonable and predictable amount of time.     Precautions / Restrictions Precautions Precautions: Fall Restrictions Weight Bearing Restrictions Per Provider Order: No      Mobility  Bed Mobility Overal bed mobility: Needs Assistance Bed Mobility: Supine to Sit     Supine to sit:  Supervision, HOB elevated, Used rails (exit L side of bed)          Transfers Overall transfer level: Needs assistance Equipment used: Rolling walker (2 wheels) Transfers: Sit to/from Stand Sit to Stand: Contact guard assist           General transfer comment: sit>stand from EOB    Ambulation/Gait Ambulation/Gait assistance: Contact guard assist Gait Distance (Feet): 75 Feet Assistive device: Rolling walker (2 wheels) Gait Pattern/deviations: Decreased step length - right, Decreased step length - left, Decreased stride length Gait velocity: decreased     General Gait Details: Multiple standing pauses/rest breaks  Stairs            Wheelchair Mobility     Tilt Bed    Modified Rankin (Stroke Patients Only)       Balance Overall balance assessment: Needs assistance Sitting-balance support: No upper extremity supported, Feet supported Sitting balance-Leahy Scale: Good     Standing balance support: Bilateral upper extremity supported, Reliant on assistive device for balance, During functional activity Standing balance-Leahy Scale: Fair                               Pertinent Vitals/Pain Pain Assessment Pain Assessment: No/denies pain    Home Living Family/patient expects to be discharged to:: Private residence Living Arrangements: Alone Available Help at Discharge: Family;Available PRN/intermittently (son planning to provide assistance initially upon d/c but asking for more resources) Type of Home: House (condo) Home Access: Level entry       Home Layout: One level Home Equipment: Shower seat -  built in      Prior Function               Mobility Comments: ambulatory without AD, only 1 fall prior to admission 2/2 episode that lead to admission, does not drive (family provides transportation) ADLs Comments: independent with bathing, dressing, cooking, cleaning, son asking for resources/home health aide to assist them, family  double checks med management     Extremity/Trunk Assessment   Upper Extremity Assessment Upper Extremity Assessment: Overall WFL for tasks assessed    Lower Extremity Assessment Lower Extremity Assessment: Overall WFL for tasks assessed;Generalized weakness       Communication   Communication Communication: No apparent difficulties    Cognition Arousal: Alert Behavior During Therapy: WFL for tasks assessed/performed   PT - Cognitive impairments: No apparent impairments                         Following commands: Intact       Cueing Cueing Techniques: Verbal cues     General Comments      Exercises     Assessment/Plan    PT Assessment Patient needs continued PT services  PT Problem List Decreased strength;Cardiopulmonary status limiting activity;Decreased range of motion;Decreased activity tolerance;Decreased balance;Decreased mobility;Decreased knowledge of precautions;Decreased safety awareness;Decreased knowledge of use of DME       PT Treatment Interventions Therapeutic exercise;Balance training;Gait training;DME instruction;Stair training;Neuromuscular re-education;Functional mobility training;Therapeutic activities;Patient/family education;Modalities    PT Goals (Current goals can be found in the Care Plan section)  Acute Rehab PT Goals Patient Stated Goal: return home, get more resources for home, keep pt in the home PT Goal Formulation: With patient/family Time For Goal Achievement: 04/14/24 Potential to Achieve Goals: Good    Frequency Min 2X/week     Co-evaluation               AM-PAC PT 6 Clicks Mobility  Outcome Measure Help needed turning from your back to your side while in a flat bed without using bedrails?: None Help needed moving from lying on your back to sitting on the side of a flat bed without using bedrails?: A Little Help needed moving to and from a bed to a chair (including a wheelchair)?: A Little Help needed  standing up from a chair using your arms (e.g., wheelchair or bedside chair)?: A Little Help needed to walk in hospital room?: A Little Help needed climbing 3-5 steps with a railing? : A Little 6 Click Score: 19    End of Session   Activity Tolerance: Patient limited by fatigue (limited 2/2 lightheadedness) Patient left: in chair;with family/visitor present (in HA bed beside nursing station) Nurse Communication: Mobility status (BP) PT Visit Diagnosis: Muscle weakness (generalized) (M62.81);Other abnormalities of gait and mobility (R26.89)    Time: 8691-8669 PT Time Calculation (min) (ACUTE ONLY): 22 min   Charges:   PT Evaluation $PT Eval Low Complexity: 1 Low   PT General Charges $$ ACUTE PT VISIT: 1 Visit         Richerd Pinal, PT, DPT 03/31/24, 2:36 PM   Richerd CHRISTELLA Pinal 03/31/2024, 2:34 PM

## 2024-03-31 NOTE — Consult Note (Signed)
 " [facility] CLINIC CARDIOLOGY CONSULT NOTE       Patient ID: Brenda Horton MRN: [id number] DOB/AGE: 17-Apr-1929 89 y.o.  Admit date: 03/30/2024 Referring Physician Dr. Delayne Solian Primary Physician [facility]  Primary Cardiologist None Reason for Consultation New onset AF RVR  HPI: Brenda Horton is a 89 y.o. female  with a past medical history of hypertension, hypothyroidism who presented to the ED on 03/30/2024 for palpitations and intermittent dizziness. Noted to be in AF RVR on initial EKG which she has no hx of. Cardiology was consulted for further evaluation.   Scented after an episode of dizziness yesterday while putting laundry away with subsequent fall.  Also reported palpitations.  Workup in the ED notable for creatinine 0.80, potassium 3.5, hemoglobin 11.2, WBC 6.0. Troponins 23 > 21. EKG in the ED atrial fibrillation RVR rate 111 bpm.  Chest x-ray without acute abnormality.  Lower extremity ultrasound without DVT.  MRI brain without acute abnormality.  Spontaneously converted to normal sinus rhythm on her own.  Did have positive orthostatics.  At the time my evaluation this morning she is resting comfortably in hospital bed with daughter at bedside eating breakfast.  We discussed her symptoms in further detail.  She states that she initially had an episode a few weeks ago whenever she had palpitations and this was associated with dizziness.  She saw her primary care about this and her home BP regimen was adjusted and diuretic was discontinued.  States that she had been doing relatively well since that time until yesterday whenever she was reaching up in her closet to put laundry away and suddenly felt dizziness and fell over.  Denies any other recent falls.  Review of systems complete and found to be negative unless listed above    Past Medical History:  Diagnosis Date   CKD (chronic kidney disease) stage 3, GFR 30-59 ml/min (HCC) 03/09/2018   Hypertension    Macular  degeneration    Thyroid  disease     Past Surgical History:  Procedure Laterality Date   ABDOMINAL HYSTERECTOMY     Partial   EYE SURGERY     FRACTURE SURGERY      (Not in a hospital admission)  Social History   Socioeconomic History   Marital status: Widowed    Spouse name: Not on file   Number of children: 5   Years of education: Not on file   Highest education level: Some college, no degree  Occupational History   Occupation: retired  Tobacco Use   Smoking status: Never   Smokeless tobacco: Never  Vaping Use   Vaping status: Never Used  Substance and Sexual Activity   Alcohol use: Yes    Comment: maybe 2 glasses of wine a month   Drug use: Never   Sexual activity: Not on file  Other Topics Concern   Not on file  Social History Narrative   Not on file   Social Drivers of Health   Tobacco Use: Low Risk (03/30/2024)   Patient History    Smoking Tobacco Use: Never    Smokeless Tobacco Use: Never    Passive Exposure: Not on file  Financial Resource Strain: Low Risk  (03/16/2024)   Received from [facility]   Overall Financial Resource Strain (CARDIA)    Difficulty of Paying Living Expenses: Not hard at all  Food Insecurity: No Food Insecurity (03/16/2024)   Received from [facility]   Epic    Within the past  12 months, you worried that your food would run out before you got the money to buy more.: Never true    Within the past 12 months, the food you bought just didn't last and you didn't have money to get more.: Never true  Transportation Needs: No Transportation Needs (03/16/2024)   Received from [facility] - Transportation    In the past 12 months, has lack of transportation kept you from medical appointments or from getting medications?: No    Lack of Transportation (Non-Medical): No  Physical Activity: Not on file  Stress: Not on file  Social Connections: Not on file  Intimate Partner Violence:  Not on file  Depression (EYV7-0): Not on file  Alcohol Screen: Not on file  Housing: Low Risk  (03/16/2024)   Received from [facility]   Epic    In the last 12 months, was there a time when you were not able to pay the mortgage or rent on time?: No    In the past 12 months, how many times have you moved where you were living?: 0    At any time in the past 12 months, were you homeless or living in a shelter (including now)?: No  Utilities: Not At Risk (03/16/2024)   Received from [facility]   Epic    In the past 12 months has the electric, gas, oil, or water company threatened to shut off services in your home?: No  Health Literacy: Not on file    Family History  Problem Relation Age of Onset   Hyperthyroidism Mother    Hypertension Father    Hyperthyroidism Daughter    Arthritis Daughter    Rheum arthritis Daughter    Hyperthyroidism Son      Vitals:   03/31/24 0018 03/31/24 0130 03/31/24 0630 03/31/24 0815  BP:  (!) 130/96 (!) 130/49   Pulse:  (!) 54 (!) 50   Resp:  17 17   Temp: 99.4 F (37.4 C)   98.5 F (36.9 C)  TempSrc: Oral   Oral  SpO2:  96% 98%   Weight:      Height:        PHYSICAL EXAM General: Well-appearing elderly female, well nourished, in no acute distress. HEENT: Normocephalic and atraumatic. Neck: No JVD.  Lungs: Normal respiratory effort on room air. Clear bilaterally to auscultation. No wheezes, crackles, rhonchi.  Heart: HRRR. Normal S1 and S2 without gallops or murmurs.  Abdomen: Non-distended appearing.  Msk: Normal strength and tone for age. Extremities: Warm and well perfused. No clubbing, cyanosis.  No edema.  Neuro: Alert and oriented X 3. Psych: Answers questions appropriately.   Labs: Basic Metabolic Panel: Recent Labs    03/30/24 1614  NA 145  K 3.5  CL 109  CO2 25  GLUCOSE 92  BUN 17  CREATININE 0.80  CALCIUM 9.2  MG 1.9   Liver Function Tests: No results for input(s): AST, ALT,  ALKPHOS, BILITOT, PROT, ALBUMIN in the last 72 hours. No results for input(s): LIPASE, AMYLASE in the last 72 hours. CBC: Recent Labs    03/30/24 1614  WBC 6.0  HGB 11.2*  HCT 34.4*  MCV 104.6*  PLT 216   Cardiac Enzymes: No results for input(s): CKTOTAL, CKMB, CKMBINDEX, TROPONINIHS in the last 72 hours. BNP: No results for input(s): BNP in the last 72 hours. D-Dimer: No results for input(s): DDIMER in the last 72 hours. Hemoglobin A1C: No results  for input(s): HGBA1C in the last 72 hours. Fasting Lipid Panel: No results for input(s): CHOL, HDL, LDLCALC, TRIG, CHOLHDL, LDLDIRECT in the last 72 hours. Thyroid  Function Tests: No results for input(s): TSH, T4TOTAL, T3FREE, THYROIDAB in the last 72 hours.  Invalid input(s): FREET3 Anemia Panel: No results for input(s): VITAMINB12, FOLATE, FERRITIN, TIBC, IRON, RETICCTPCT in the last 72 hours.   Radiology: MR BRAIN WO CONTRAST Result Date: 03/31/2024 EXAM: MRI BRAIN WITHOUT CONTRAST 03/31/2024 03:23:44 AM TECHNIQUE: Multiplanar multisequence MRI of the head/brain was performed without the administration of intravenous contrast. COMPARISON: MRI head 05/03/2023 CLINICAL HISTORY: Neuro deficit, acute, stroke suspected FINDINGS: BRAIN AND VENTRICLES: No acute infarct. No intracranial hemorrhage. No mass. No midline shift. No hydrocephalus. The sella is unremarkable. Normal flow voids. ORBITS: No significant abnormality. SINUSES AND MASTOIDS: No significant abnormality. BONES AND SOFT TISSUES: Normal marrow signal. No soft tissue abnormality. IMPRESSION: 1. No acute abnormality. Electronically signed by: Glendia Molt MD 03/31/2024 03:41 AM EST RP Workstation: [id number]   US  Venous Img Lower Bilateral Result Date: 03/30/2024 EXAM: ULTRASOUND DUPLEX OF THE BILATERAL LOWER EXTREMITY VEINS TECHNIQUE: Duplex ultrasound using B-mode/gray scaled imaging and Doppler spectral analysis and  color flow was obtained of the deep venous structures of the bilateral lower extremity. COMPARISON: None available. CLINICAL HISTORY: R > L new swelling, warmth. eval DVT FINDINGS: RIGHT: The common femoral vein, femoral vein, popliteal vein, and posterior tibial vein demonstrate normal compressibility with normal color flow and spectral analysis. 3.7 x 1.3 x 4.0 cm right popliteal fossa cyst. LEFT: The common femoral vein, femoral vein, popliteal vein, and posterior tibial vein demonstrate normal compressibility with normal color flow and spectral analysis. IMPRESSION: 1. No deep venous thrombosis in the bilateral lower extremity. 2. 3.7 x 1.3 x 4.0 cm right popliteal fossa cyst. Electronically signed by: Pinkie Pebbles MD 03/30/2024 11:00 PM EST RP Workstation: [id number]   DG Chest 2 View Result Date: 03/30/2024 EXAM: 2 VIEW(S) XRAY OF THE CHEST 03/30/2024 05:17:00 PM COMPARISON: None available. CLINICAL HISTORY: Chest pain Chest pain chest pain chest pain FINDINGS: LUNGS AND PLEURA: No focal pulmonary opacity. No pleural effusion. No pneumothorax. HEART AND MEDIASTINUM: No acute abnormality of the cardiac and mediastinal silhouettes. BONES AND SOFT TISSUES: No acute osseous abnormality. IMPRESSION: 1. No acute process. Electronically signed by: Franky Crease MD 03/30/2024 05:19 PM EST RP Workstation: [id number]    ECHO ordered  TELEMETRY (personally reviewed): Sinus bradycardia rate 50s  EKG (personally reviewed): Atrial fibrillation RVR rate 111 bpm  Data reviewed by me 03/31/2024: last 24h vitals tele labs imaging I/O ED provider note, admission H&P  Active Problems:   Hypertension   Hypothyroidism   New onset paroxysmal atrial fibrillation with RVR (HCC)   Vertigo   Orthostatic hypotension   Sinus bradycardia / tachybradysyndrome   Advanced age    ASSESSMENT AND PLAN:  Brenda Horton is a 89 y.o. female  with a past medical history of hypertension, hypothyroidism who presented to the  ED on 03/30/2024 for palpitations and intermittent dizziness. Noted to be in AF RVR on initial EKG which she has no hx of. Cardiology was consulted for further evaluation.   # New onset atrial fibrillation # Paroxysmal atrial fibrillation # Orthostatic hypotension Patient presented with dizziness, palpitations. Found to be in new onset atrial fibrillation, converted back to NSR without intervention. Also noted to have orthostatic hypotension. Recent issues with dizziness and home BP meds had been adjusted.  - Will consider some low dose metoprolol  succinate 12.5 mg. Would  defer restarting home losartan.  - Will start low dose eliquis  for stroke risk reduction given age and recent issues with dizziness/falls. - Echo ordered, further recommendations pending these results - Minimal and flat troponin most consistent with demand/supply mismatch and not ACS.  No plan for further cardiac invasive procedures.   This patient's plan of care was discussed and created with Dr. Florencio and he is in agreement.  Signed: Danita Bloch, PA-C  03/31/2024, 9:18 AM [facility]      "

## 2024-03-31 NOTE — Hospital Course (Addendum)
 Partly taken from H&P.  Brenda Horton is a 89 y.o. female with medical history significant for Hypertension and hypothyroidism who is functionally independent and lives alone being admitted new onset paroxysmal A-fib with RVR. She presented with 2 weeks of episodic dizziness and palpitations, resulting in a fall on the day of arrival.  On presentation she was found to be in A-fib with RVR, spontaneously converted back to sinus bradycardia in mid 50s.  Positive orthostatic vitals. She did receive a small dose of oral metoprolol  of 12.5 mg.  Remained bradycardic and symptomatic for weakness.  Labs stable except mildly elevated troponin at 21, potassium 3.5 and magnesium  1.9. CXR was negative for any acute abnormality, lower extremity venous Doppler negative for DVT but did show a right popliteal fossa cyst.  Echocardiogram ordered and cardiology was consulted.  1/21: Vital stable with heart rate at 50.  MRI brain was negative for any acute abnormality.  Orthostatic vitals negative.  Remained in sinus rhythm with heart rate in low 50s.  PT recommending home health which was ordered. Echocardiogram pending.

## 2024-03-31 NOTE — ED Notes (Signed)
 Dietary called for a warm food tray.

## 2024-03-31 NOTE — H&P (Signed)
 " History and Physical    Patient: Brenda Horton FMW:[id number] DOB: 1929/12/23 DOA: 03/30/2024 DOS: the patient was seen and examined on 03/31/2024 PCP: [facility]  Patient coming from: Home  Chief Complaint:  Chief Complaint  Patient presents with   Dizziness   Near Syncope    HPI: Brenda Horton is a 89 y.o. female with medical history significant for Hypertension and hypothyroidism who is functionally independent and lives alone being admitted new onset paroxysmal A-fib with RVR. She presented with 2 weeks of episodic dizziness and palpitations, resulting in a fall on the day of arrival.  States that 2 weeks ago she was sitting on the side of her bed and she had sudden onset dizziness to where she had to crawl to the bathroom due to fear of falling.  She described the spell as the room spinning.  She saw her PCP the following day and he discontinued her diuretic and replaced it with bisoprolol.  Today she was putting laundry in her closet when she had another episode of dizziness, associated with palpitations, losing her balance and falling, thus prompting visit to the ED.  Daughter at the bedside states that she has been having palpitations for quite some time and this preceded the 2 episodes of dizziness she has associated denies cough fever or chills.  Denies recent nausea or vomiting Upon arrival, she was in rapid A-fib in the 1 teens and then spontaneously converted to sinus bradycardia in the mid 50s.  She however had positive orthostatics. She was treated with oral metoprolol  12.5 mg and has remained persistently bradycardic and symptomatic for  feeling weak Labs were mostly unremarkable except for first troponin of 21 and a hemoglobin of 11.2 which is her baseline.   Potassium 3.5 and magnesium  1.9. EKG #1 showed A-fib at 111 EKG #2 at the time of admission showed sinus bradycardia at 57 Chest x-ray was nonacute and lower extremity venous ultrasound negative for DVT but  did show a right popliteal fossa cyst  Admission requested      Past Medical History:  Diagnosis Date   CKD (chronic kidney disease) stage 3, GFR 30-59 ml/min (HCC) 03/09/2018   Hypertension    Macular degeneration    Thyroid  disease    Past Surgical History:  Procedure Laterality Date   ABDOMINAL HYSTERECTOMY     Partial   EYE SURGERY     FRACTURE SURGERY     Social History:  reports that she has never smoked. She has never used smokeless tobacco. She reports current alcohol use. She reports that she does not use drugs.  Allergies[1]  Family History  Problem Relation Age of Onset   Hyperthyroidism Mother    Hypertension Father    Hyperthyroidism Daughter    Arthritis Daughter    Rheum arthritis Daughter    Hyperthyroidism Son     Prior to Admission medications  Medication Sig Start Date End Date Taking? Authorizing Provider  Coenzyme Q10 10 MG capsule Take 10 mg by mouth daily.     [provider]  colestipol  (COLESTID ) 1 g tablet Take 4 tablets (4 g total) by mouth 2 (two) times daily. 07/12/20 08/11/20  Tahiliani, Varnita B, MD  diclofenac  (VOLTAREN ) 25 MG EC tablet Take 1 tablet (25 mg total) by mouth 2 (two) times daily. 08/25/20   Claudene Tanda POUR, PA-C  levothyroxine  (SYNTHROID ) 112 MCG tablet Take 1 tablet (112 mcg total) by mouth daily before breakfast. 07/26/20   Flinchum, Rosaline RAMAN, FNP  lisinopril -hydrochlorothiazide  (ZESTORETIC ) 20-12.5 MG tablet Take 1 tablet by mouth daily. 07/26/20   Flinchum, Michelle S, FNP  Multiple Vitamin (MULTIVITAMIN) capsule Take 1 capsule by mouth daily.     [provider]  Multiple Vitamins-Minerals (PRESERVISION AREDS 2) CAPS Take 2 capsules by mouth daily.     [provider]  Probiotic Product (ALIGN) 4 MG CAPS Take 4 mg by mouth daily.    [provider]  psyllium (METAMUCIL) 58.6 % packet Take 1 packet by mouth daily.     [provider]  RESTASIS 0.05 % ophthalmic emulsion Place 1 drop  into both eyes 2 (two) times daily.  04/27/19   [provider]    Physical Exam: Vitals:   03/30/24 2200 03/30/24 2300 03/30/24 2330 03/31/24 0000  BP: (!) 145/46 106/87 103/81 115/70  Pulse: (!) 54 (!) 51 (!) 52 (!) 54  Resp: 14 14 12 19   Temp:      TempSrc:      SpO2: 98% 100% 99% 96%  Weight:      Height:       Physical Exam Vitals and nursing note reviewed.  Constitutional:      General: She is not in acute distress. HENT:     Head: Normocephalic and atraumatic.  Cardiovascular:     Rate and Rhythm: Regular rhythm. Bradycardia present.     Heart sounds: Normal heart sounds.  Pulmonary:     Effort: Pulmonary effort is normal.     Breath sounds: Normal breath sounds.  Abdominal:     Palpations: Abdomen is soft.     Tenderness: There is no abdominal tenderness.  Neurological:     Mental Status: Mental status is at baseline.     Labs on Admission: I have personally reviewed following labs and imaging studies  CBC: Recent Labs  Lab 03/30/24 1614  WBC 6.0  HGB 11.2*  HCT 34.4*  MCV 104.6*  PLT 216   Basic Metabolic Panel: Recent Labs  Lab 03/30/24 1614  NA 145  K 3.5  CL 109  CO2 25  GLUCOSE 92  BUN 17  CREATININE 0.80  CALCIUM 9.2  MG 1.9   GFR: Estimated Creatinine Clearance: 36.9 mL/min (by C-G formula based on SCr of 0.8 mg/dL). Liver Function Tests: No results for input(s): AST, ALT, ALKPHOS, BILITOT, PROT, ALBUMIN in the last 168 hours. No results for input(s): LIPASE, AMYLASE in the last 168 hours. No results for input(s): AMMONIA in the last 168 hours. Coagulation Profile: No results for input(s): INR, PROTIME in the last 168 hours. Cardiac Enzymes: No results for input(s): CKTOTAL, CKMB, CKMBINDEX, TROPONINI in the last 168 hours. BNP (last 3 results) No results for input(s): PROBNP in the last 8760 hours. HbA1C: No results for input(s): HGBA1C in the last 72 hours. CBG: No results for  input(s): GLUCAP in the last 168 hours. Lipid Profile: No results for input(s): CHOL, HDL, LDLCALC, TRIG, CHOLHDL, LDLDIRECT in the last 72 hours. Thyroid  Function Tests: No results for input(s): TSH, T4TOTAL, FREET4, T3FREE, THYROIDAB in the last 72 hours. Anemia Panel: No results for input(s): VITAMINB12, FOLATE, FERRITIN, TIBC, IRON, RETICCTPCT in the last 72 hours. Urine analysis:    Component Value Date/Time   COLORURINE YELLOW (A) 03/30/2024 2120   APPEARANCEUR CLEAR (A) 03/30/2024 2120   LABSPEC 1.010 03/30/2024 2120   PHURINE 5.0 03/30/2024 2120   GLUCOSEU NEGATIVE 03/30/2024 2120   HGBUR NEGATIVE 03/30/2024 2120   BILIRUBINUR NEGATIVE 03/30/2024 2120   KETONESUR NEGATIVE 03/30/2024  2120   PROTEINUR NEGATIVE 03/30/2024 2120   NITRITE NEGATIVE 03/30/2024 2120   LEUKOCYTESUR NEGATIVE 03/30/2024 2120    Radiological Exams on Admission: US  Venous Img Lower Bilateral Result Date: 03/30/2024 EXAM: ULTRASOUND DUPLEX OF THE BILATERAL LOWER EXTREMITY VEINS TECHNIQUE: Duplex ultrasound using B-mode/gray scaled imaging and Doppler spectral analysis and color flow was obtained of the deep venous structures of the bilateral lower extremity. COMPARISON: None available. CLINICAL HISTORY: R > L new swelling, warmth. eval DVT FINDINGS: RIGHT: The common femoral vein, femoral vein, popliteal vein, and posterior tibial vein demonstrate normal compressibility with normal color flow and spectral analysis. 3.7 x 1.3 x 4.0 cm right popliteal fossa cyst. LEFT: The common femoral vein, femoral vein, popliteal vein, and posterior tibial vein demonstrate normal compressibility with normal color flow and spectral analysis. IMPRESSION: 1. No deep venous thrombosis in the bilateral lower extremity. 2. 3.7 x 1.3 x 4.0 cm right popliteal fossa cyst. Electronically signed by: Pinkie Pebbles MD 03/30/2024 11:00 PM EST RP Workstation: [id number]   DG Chest 2 View Result Date:  03/30/2024 EXAM: 2 VIEW(S) XRAY OF THE CHEST 03/30/2024 05:17:00 PM COMPARISON: None available. CLINICAL HISTORY: Chest pain Chest pain chest pain chest pain FINDINGS: LUNGS AND PLEURA: No focal pulmonary opacity. No pleural effusion. No pneumothorax. HEART AND MEDIASTINUM: No acute abnormality of the cardiac and mediastinal silhouettes. BONES AND SOFT TISSUES: No acute osseous abnormality. IMPRESSION: 1. No acute process. Electronically signed by: Franky Crease MD 03/30/2024 05:19 PM EST RP Workstation: [id number]   Data Reviewed for HPI: Relevant notes from primary care and specialist visits, past discharge summaries as available in EHR, including Care Everywhere. Prior diagnostic testing as pertinent to current admission diagnoses Updated medications and problem lists for reconciliation ED course, including vitals, labs, imaging, treatment and response to treatment Triage notes, nursing and pharmacy notes and ED provider's notes Notable results as noted above in HPI      Assessment and Plan: New onset paroxysmal atrial fibrillation with RVR (HCC) Symptomatic sinus bradycardia, possibly related to beta-blocker Patient presents with palpitations dizziness, found to be in new onset A-fib, rate 110 that  spontaneously converted to sinus bradycardia Patient was started on bisoprolol on 1/7 following her first episode of dizziness IV fluid bolus Hold bisoprolol Continuous cardiac monitoring Echocardiogram Getting MRI to rule out central cause Cardiology consult Fall precautions Will defer to cardiology for recommendations for anticoagulation  Vertigo Possibly BPPV however in the setting of new onset A-fib, will get MRI brain to evaluate for central vertigo, possible TIA CVA PT OT eval Follow-up MRI  Hypertension Will hold home lisinopril  hydrochlorothiazide  for now given positive orthostatics  Hypothyroidism Continue levothyroxine   Advanced age Macular degeneration Supportive  care    DVT prophylaxis: Lovenox   Consults: [facility] cardiology  Advance Care Planning: full code  Family Communication: daughter Beula at bedside  Disposition Plan: Back to previous home environment  Severity of Illness: The appropriate patient status for this patient is OBSERVATION. Observation status is judged to be reasonable and necessary in order to provide the required intensity of service to ensure the patient's safety. The patient's presenting symptoms, physical exam findings, and initial radiographic and laboratory data in the context of their medical condition is felt to place them at decreased risk for further clinical deterioration. Furthermore, it is anticipated that the patient will be medically stable for discharge from the hospital within 2 midnights of admission.   Author: Delayne LULLA Solian, MD 03/31/2024 12:11 AM  For on call review [email or web address].      [  1] No Known Allergies  "

## 2024-03-31 NOTE — Assessment & Plan Note (Deleted)
 Orthostatic hypotension Will give a small IV bolus Neurologic checks with fall and aspiration precautions Continue cardiac workup under respective problem

## 2024-03-31 NOTE — Assessment & Plan Note (Addendum)
 Blood pressure currently within goal. -Holding home HCTZ and lisinopril 

## 2024-04-01 ENCOUNTER — Observation Stay
Admit: 2024-04-01 | Discharge: 2024-04-01 | Disposition: A | Discharge status: Home / Self Care | Attending: Internal Medicine | Admitting: Internal Medicine

## 2024-04-01 ENCOUNTER — Other Ambulatory Visit (HOSPITAL_COMMUNITY): Payer: Self-pay

## 2024-04-01 ENCOUNTER — Telehealth (HOSPITAL_COMMUNITY): Payer: Self-pay

## 2024-04-01 DIAGNOSIS — I4891 Unspecified atrial fibrillation: Secondary | ICD-10-CM | POA: Diagnosis not present

## 2024-04-01 LAB — COMPREHENSIVE METABOLIC PANEL WITH GFR
ALT: 11 U/L (ref 0–44)
AST: 14 U/L — ABNORMAL LOW (ref 15–41)
Albumin: 3.3 g/dL — ABNORMAL LOW (ref 3.5–5.0)
Alkaline Phosphatase: 27 U/L — ABNORMAL LOW (ref 38–126)
Anion gap: 10 (ref 5–15)
BUN: 13 mg/dL (ref 8–23)
CO2: 22 mmol/L (ref 22–32)
Calcium: 8.2 mg/dL — ABNORMAL LOW (ref 8.9–10.3)
Chloride: 109 mmol/L (ref 98–111)
Creatinine, Ser: 0.72 mg/dL (ref 0.44–1.00)
GFR, Estimated: >60 mL/min
Glucose, Bld: 91 mg/dL (ref 70–99)
Potassium: 2.6 mmol/L — CL (ref 3.5–5.1)
Sodium: 141 mmol/L (ref 135–145)
Total Bilirubin: 0.5 mg/dL (ref 0.0–1.2)
Total Protein: 5.3 g/dL — ABNORMAL LOW (ref 6.5–8.1)

## 2024-04-01 LAB — BASIC METABOLIC PANEL WITH GFR
Anion gap: 9 (ref 5–15)
Anion gap: 12 (ref 5–15)
BUN: 14 mg/dL (ref 8–23)
BUN: 14 mg/dL (ref 8–23)
CO2: 23 mmol/L (ref 22–32)
CO2: 19 mmol/L — ABNORMAL LOW (ref 22–32)
Calcium: 8.3 mg/dL — ABNORMAL LOW (ref 8.9–10.3)
Calcium: 8.3 mg/dL — ABNORMAL LOW (ref 8.9–10.3)
Chloride: 110 mmol/L (ref 98–111)
Chloride: 109 mmol/L (ref 98–111)
Creatinine, Ser: 0.75 mg/dL (ref 0.44–1.00)
Creatinine, Ser: 0.82 mg/dL (ref 0.44–1.00)
GFR, Estimated: >60 mL/min
GFR, Estimated: >60 mL/min
Glucose, Bld: 89 mg/dL (ref 70–99)
Glucose, Bld: 90 mg/dL (ref 70–99)
Potassium: 2.6 mmol/L — CL (ref 3.5–5.1)
Potassium: 3.5 mmol/L (ref 3.5–5.1)
Sodium: 142 mmol/L (ref 135–145)
Sodium: 140 mmol/L (ref 135–145)

## 2024-04-01 LAB — ECHOCARDIOGRAM COMPLETE
AR max vel: 2.19 cm2
AV Area VTI: 2.34 cm2
AV Area mean vel: 2.15 cm2
AV Mean grad: 6 mmHg
AV Peak grad: 11.3 mmHg
Ao pk vel: 1.68 m/s
Area-P 1/2: 4.1 cm2
Height: 60 in
MV VTI: 2.17 cm2
S' Lateral: 3 cm
Weight: 2194.02 [oz_av]

## 2024-04-01 LAB — MAGNESIUM
Magnesium: 1.6 mg/dL — ABNORMAL LOW (ref 1.7–2.4)
Magnesium: 2.3 mg/dL (ref 1.7–2.4)

## 2024-04-01 LAB — CBC
HCT: 28.7 % — ABNORMAL LOW (ref 36.0–46.0)
Hemoglobin: 9.4 g/dL — ABNORMAL LOW (ref 12.0–15.0)
MCH: 33.9 pg (ref 26.0–34.0)
MCHC: 32.8 g/dL (ref 30.0–36.0)
MCV: 103.6 fL — ABNORMAL HIGH (ref 80.0–100.0)
Platelets: 159 K/uL (ref 150–400)
RBC: 2.77 MIL/uL — ABNORMAL LOW (ref 3.87–5.11)
RDW: 13.8 % (ref 11.5–15.5)
WBC: 6.3 K/uL (ref 4.0–10.5)
nRBC: 0 % (ref 0.0–0.2)

## 2024-04-01 LAB — GLUCOSE, CAPILLARY: Glucose-Capillary: 81 mg/dL (ref 70–99)

## 2024-04-01 MED ORDER — COLESTIPOL HCL 1 G PO TABS
4.0000 g | ORAL_TABLET | Freq: Two times a day (BID) | ORAL | Status: DC
  Administered 2024-04-01: 1 g via ORAL
  Filled 2024-04-01: qty 4

## 2024-04-01 MED ORDER — VARENICLINE TARTRATE 0.03 MG/ACT NA SOLN
0.0300 mg | Freq: Every day | NASAL | Status: DC
  Administered 2024-04-01 – 2024-04-02 (×2): 0.03 mg via NASAL

## 2024-04-01 MED ORDER — POTASSIUM CHLORIDE CRYS ER 20 MEQ PO TBCR
40.0000 meq | EXTENDED_RELEASE_TABLET | Freq: Once | ORAL | Status: AC
  Administered 2024-04-01: 40 meq via ORAL

## 2024-04-01 MED ORDER — COLESTIPOL HCL 1 G PO TABS
1.0000 g | ORAL_TABLET | Freq: Two times a day (BID) | ORAL | Status: DC
  Administered 2024-04-01 – 2024-04-02 (×2): 1 g via ORAL
  Filled 2024-04-01 (×3): qty 1

## 2024-04-01 MED ORDER — PSYLLIUM 95 % PO PACK
1.0000 | PACK | Freq: Every day | ORAL | Status: DC
  Administered 2024-04-01 – 2024-04-02 (×2): 1 via ORAL
  Filled 2024-04-01 (×2): qty 1

## 2024-04-01 MED ORDER — VARENICLINE TARTRATE 0.03 MG/ACT NA SOLN
0.0300 mg | Freq: Every day | NASAL | Status: DC
  Filled 2024-04-01: qty 1

## 2024-04-01 MED ORDER — METOPROLOL SUCCINATE ER 25 MG PO TB24: 12.5000 mg | ORAL_TABLET | Freq: Every day | ORAL | Status: DC

## 2024-04-01 MED ORDER — POTASSIUM CHLORIDE CRYS ER 20 MEQ PO TBCR
40.0000 meq | EXTENDED_RELEASE_TABLET | Freq: Once | ORAL | Status: AC
  Administered 2024-04-01: 40 meq via ORAL
  Filled 2024-04-01: qty 2

## 2024-04-01 MED ORDER — MAGNESIUM SULFATE 2 GM/50ML IV SOLN
2.0000 g | Freq: Once | INTRAVENOUS | Status: AC
  Administered 2024-04-01: 2 g via INTRAVENOUS
  Filled 2024-04-01: qty 50

## 2024-04-01 MED ORDER — POTASSIUM CHLORIDE 10 MEQ/100ML IV SOLN
10.0000 meq | INTRAVENOUS | Status: AC
  Administered 2024-04-01 (×4): 10 meq via INTRAVENOUS
  Filled 2024-04-01 (×4): qty 100

## 2024-04-01 NOTE — Progress Notes (Signed)
*  PRELIMINARY RESULTS* Echocardiogram 2D Echocardiogram has been performed.  Brenda Horton 04/01/2024, 10:50 AM

## 2024-04-01 NOTE — Care Management Obs Status (Signed)
 MEDICARE OBSERVATION STATUS NOTIFICATION   Patient Details  Name: Brenda Horton MRN: [id number] Date of Birth: 1929-07-24   Medicare Observation Status Notification Given:  Chaney BRANDY CHRISTIANE LELON, CMA 04/01/2024, 1:17 PM

## 2024-04-01 NOTE — Telephone Encounter (Signed)
 Pharmacy Patient Advocate Encounter  Insurance verification completed.    The patient is insured through [employer]. Patient has Medicare and is not eligible for a copay card, but may be able to apply for patient assistance or Medicare RX Payment Plan (Patient Must reach out to their plan, if eligible for payment plan), if available.    Ran test claim for Eliquis  5mg  tablet and the current 30 day co-pay is $30.   This test claim was processed through [employer]- copay amounts may vary at other pharmacies due to [employer], or as the patient moves through the different stages of their insurance plan.

## 2024-04-01 NOTE — Plan of Care (Signed)
   Problem: Education: Goal: Knowledge of condition and prescribed therapy will improve Outcome: Progressing   Problem: Cardiac: Goal: Will achieve and/or maintain adequate cardiac output Outcome: Progressing   Problem: Physical Regulation: Goal: Complications related to the disease process, condition or treatment will be avoided or minimized Outcome: Progressing

## 2024-04-01 NOTE — Progress Notes (Signed)
 " PROGRESS NOTE    Brenda Horton  FMW:[id number] DOB: 1929-07-07 DOA: 03/30/2024 PCP: [facility]  Subjective: No acute events overnight. Seen and examined at bedside with son present. Reports feeling better with resolution of dizziness and palpitation. Waiting for 1-2 hours after colestipol  dose this morning to eat breakfast. Denies nausea, vomiting, diarrhea. Reports having 1BM per day since admission.   Hospital Course: Partly taken from H&P.  Brenda Horton is a 89 y.o. female with medical history significant for Hypertension and hypothyroidism who is functionally independent and lives alone being admitted new onset paroxysmal A-fib with RVR. She presented with 2 weeks of episodic dizziness and palpitations, resulting in a fall on the day of arrival.  On presentation she was found to be in A-fib with RVR, spontaneously converted back to sinus bradycardia in mid 50s.  Positive orthostatic vitals. She did receive a small dose of oral metoprolol  of 12.5 mg.  Remained bradycardic and symptomatic for weakness.  Labs stable except mildly elevated troponin at 21, potassium 3.5 and magnesium  1.9. CXR was negative for any acute abnormality, lower extremity venous Doppler negative for DVT but did show a right popliteal fossa cyst.  Echocardiogram ordered and cardiology was consulted.  1/21: Vital stable with heart rate at 50.  MRI brain was negative for any acute abnormality.  Orthostatic vitals negative.  Remained in sinus rhythm with heart rate in low 50s.  PT recommending home health which was ordered. Echocardiogram pending.     Assessment and Plan:  New onset paroxysmal atrial fibrillation with RVR (HCC) Symptomatic sinus bradycardia Patient presents with palpitations dizziness, found to be in new onset A-fib, rate 110 that spontaneously converted to sinus bradycardia Patient was started outpatient on bisoprolol on 1/7 following her first episode of dizziness Echocardiogram  showed LVEF 55-60%, no WMAs, grade 1 diastolic dysfunction Status post IV fluid bolus Hold bisoprolol and metoprolol  Cont low dose eliquis  Plan to start metoprolol  PRN on discharge as per cardiology recs Continuous cardiac monitoring Cardiology following  Hypokalemia Hypomagnesemia -Etiology unclear, maybe a chronic issue as per patient history - as per son, patient was recently taken off magnesium  supplementation by GI due to concerns it may be causing her chronic diarrhea  - will replete potassium - will give IV MG for now - monitor on tele as elsewhere - monitor BMP  Orthostatic hypotension Hx of hypertension Status post IV fluids. Repeat orthostatic vitals were negative 1/22 -Holding home HCTZ and lisinopril  given osthostatic hypotension  Vertigo MRI negative Possibly BPPV, however, in the setting of new onset A-fib,  PT OT eval-recommending home health Can consider meclizine PRN May benefit from vestibular rehab exercises as outpatient  Hypothyroidism Continue levothyroxine   Chronic diarrhea Follows with gastroenterology Was recently taken off oral magnesium  supplementation outpatient due to concerns this may be exacerbating the diarrhea Cont on home colestipol  and psyllium Monitor BMs  Advanced age Macular degeneration Supportive care  DVT prophylaxis: apixaban  (ELIQUIS ) tablet 2.5 mg Start: 03/31/24 1030 apixaban  (ELIQUIS ) tablet 2.5 mg  Eliquis    Code Status: Full Code Family Communication: updated son at bedside Disposition Plan: Home with [facility] Reason for continuing need for hospitalization: severe electrolyte derangements, monitor for clinical improvement  Objective: Vitals:   03/31/24 2324 04/01/24 0500 04/01/24 0800 04/01/24 1158  BP: 139/75  (!) 167/51 (!) 140/51  Pulse: 66  (!) 53 64  Resp: 18  18 19   Temp: 99 F (37.2 C)  98.3 F (36.8 C) 99.2 F (37.3 C)  TempSrc: Oral  Oral Oral  SpO2:   96% 96%  Weight:  62.2 kg    Height:       No  intake or output data in the 24 hours ending 04/01/24 1222 Filed Weights   03/30/24 1557 03/31/24 2210 04/01/24 0500  Weight: 62.1 kg 62.2 kg 62.2 kg    Examination:  Physical Exam Vitals and nursing note reviewed.  Constitutional:      General: She is not in acute distress.    Appearance: She is ill-appearing.  HENT:     Head: Normocephalic and atraumatic.  Cardiovascular:     Rate and Rhythm: Normal rate and regular rhythm.     Pulses: Normal pulses.     Heart sounds: Normal heart sounds.  Pulmonary:     Effort: Pulmonary effort is normal.     Breath sounds: Normal breath sounds.  Abdominal:     General: Bowel sounds are normal.     Palpations: Abdomen is soft.  Neurological:     Mental Status: She is alert. Mental status is at baseline.     Data Reviewed: I have personally reviewed following labs and imaging studies  CBC: Recent Labs  Lab 03/30/24 1614 04/01/24 0404  WBC 6.0 6.3  HGB 11.2* 9.4*  HCT 34.4* 28.7*  MCV 104.6* 103.6*  PLT 216 159   Basic Metabolic Panel: Recent Labs  Lab 03/30/24 1614 04/01/24 0401 04/01/24 0404 04/01/24 0548  NA 145  --  142 141  K 3.5  --  2.6* 2.6*  CL 109  --  110 109  CO2 25  --  23 22  GLUCOSE 92  --  89 91  BUN 17  --  14 13  CREATININE 0.80  --  0.75 0.72  CALCIUM 9.2  --  8.3* 8.2*  MG 1.9 1.6*  --   --    GFR: Estimated Creatinine Clearance: 37 mL/min (by C-G formula based on SCr of 0.72 mg/dL). Liver Function Tests: Recent Labs  Lab 04/01/24 0548  AST 14*  ALT 11  ALKPHOS 27*  BILITOT 0.5  PROT 5.3*  ALBUMIN 3.3*   No results for input(s): LIPASE, AMYLASE in the last 168 hours. No results for input(s): AMMONIA in the last 168 hours. Coagulation Profile: No results for input(s): INR, PROTIME in the last 168 hours. Cardiac Enzymes: No results for input(s): CKTOTAL, CKMB, CKMBINDEX, TROPONINI in the last 168 hours. ProBNP, BNP (last 5 results) No results for input(s): PROBNP,  BNP in the last 8760 hours. HbA1C: No results for input(s): HGBA1C in the last 72 hours. CBG: Recent Labs  Lab 03/31/24 0606 04/01/24 0553  GLUCAP 88 81   Lipid Profile: No results for input(s): CHOL, HDL, LDLCALC, TRIG, CHOLHDL, LDLDIRECT in the last 72 hours. Thyroid  Function Tests: No results for input(s): TSH, T4TOTAL, FREET4, T3FREE, THYROIDAB in the last 72 hours. Anemia Panel: No results for input(s): VITAMINB12, FOLATE, FERRITIN, TIBC, IRON, RETICCTPCT in the last 72 hours. Sepsis Labs: No results for input(s): PROCALCITON, LATICACIDVEN in the last 168 hours.  No results found for this or any previous visit (from the past 240 hours).   Radiology Studies: ECHOCARDIOGRAM COMPLETE Result Date: 04/01/2024    ECHOCARDIOGRAM REPORT   Patient Name:   ANNISON BIRCHARD Date of Exam: 04/01/2024 Medical Rec #:  [id number]      Height:       60.0 in Accession #:    [id number]     Weight:       137.1 lb Date  of Birth:  1929-05-11      BSA:          1.590 m Patient Age:    89 years       BP:           139/75 mmHg Patient Gender: F              HR:           66 bpm. Exam Location:  [facility] Procedure: 2D Echo, Cardiac Doppler and Color Doppler (Both Spectral and Color            Flow Doppler were utilized during procedure). Indications:     Syncope R55  History:         Patient has no prior history of Echocardiogram examinations.                  Risk Factors:Hypertension.  Sonographer:     Christopher Furnace Referring Phys:  [id number] SUMAYYA AMIN Diagnosing Phys: Dwayne D Callwood MD IMPRESSIONS  1. Left ventricular ejection fraction, by estimation, is 55 to 60%. The left ventricle has normal function. The left ventricle has no regional wall motion abnormalities. Left ventricular diastolic parameters are consistent with Grade I diastolic dysfunction (impaired relaxation).  2. Right ventricular systolic function is normal. The right ventricular size is normal.  3. Left atrial  size was mild to moderately dilated.  4. The mitral valve is grossly normal. Mild to moderate mitral valve regurgitation.  5. The aortic valve is grossly normal. Aortic valve regurgitation is not visualized. FINDINGS  Left Ventricle: Left ventricular ejection fraction, by estimation, is 55 to 60%. The left ventricle has normal function. The left ventricle has no regional wall motion abnormalities. Strain was performed and the global longitudinal strain is indeterminate. The left ventricular internal cavity size was normal in size. There is no left ventricular hypertrophy. Left ventricular diastolic parameters are consistent with Grade I diastolic dysfunction (impaired relaxation). Right Ventricle: The right ventricular size is normal. No increase in right ventricular wall thickness. Right ventricular systolic function is normal. Left Atrium: Left atrial size was mild to moderately dilated. Right Atrium: Right atrial size was normal in size. Pericardium: There is no evidence of pericardial effusion. Mitral Valve: The mitral valve is grossly normal. Mild to moderate mitral valve regurgitation. MV peak gradient, 6.9 mmHg. The mean mitral valve gradient is 3.0 mmHg. Tricuspid Valve: The tricuspid valve is normal in structure. Tricuspid valve regurgitation is mild. Aortic Valve: The aortic valve is grossly normal. Aortic valve regurgitation is not visualized. Aortic valve mean gradient measures 6.0 mmHg. Aortic valve peak gradient measures 11.3 mmHg. Aortic valve area, by VTI measures 2.34 cm. Pulmonic Valve: The pulmonic valve was not well visualized. Pulmonic valve regurgitation is not visualized. Aorta: The aortic root was not well visualized. IAS/Shunts: No atrial level shunt detected by color flow Doppler. Additional Comments: 3D was performed not requiring image post processing on an independent workstation and was normal.  LEFT VENTRICLE PLAX 2D LVIDd:         4.20 cm   Diastology LVIDs:         3.00 cm   LV e'  medial:    7.40 cm/s LV PW:         0.90 cm   LV E/e' medial:  12.0 LV IVS:        0.90 cm   LV e' lateral:   9.79 cm/s LVOT diam:     2.00 cm  LV E/e' lateral: 9.1 LV SV:         95 LV SV Index:   60 LVOT Area:     3.14 cm  RIGHT VENTRICLE RV Basal diam:  2.80 cm RV Mid diam:    2.30 cm LEFT ATRIUM              Index        RIGHT ATRIUM           Index LA diam:        3.30 cm  2.08 cm/m   RA Area:     11.70 cm LA Vol (A2C):   101.0 ml 63.52 ml/m  RA Volume:   25.30 ml  15.91 ml/m LA Vol (A4C):   53.9 ml  33.90 ml/m LA Biplane Vol: 80.8 ml  50.82 ml/m  AORTIC VALVE AV Area (Vmax):    2.19 cm AV Area (Vmean):   2.15 cm AV Area (VTI):     2.34 cm AV Vmax:           168.00 cm/s AV Vmean:          112.000 cm/s AV VTI:            0.405 m AV Peak Grad:      11.3 mmHg AV Mean Grad:      6.0 mmHg LVOT Vmax:         117.00 cm/s LVOT Vmean:        76.500 cm/s LVOT VTI:          0.302 m LVOT/AV VTI ratio: 0.75  AORTA Ao Root diam: 3.30 cm MITRAL VALVE MV Area (PHT): 4.10 cm     SHUNTS MV Area VTI:   2.17 cm     Systemic VTI:  0.30 m MV Peak grad:  6.9 mmHg     Systemic Diam: 2.00 cm MV Mean grad:  3.0 mmHg MV Vmax:       1.31 m/s MV Vmean:      76.7 cm/s MV Decel Time: 185 msec MV E velocity: 88.60 cm/s MV A velocity: 115.00 cm/s MV E/A ratio:  0.77 Dwayne D Callwood MD Electronically signed by Cara JONETTA Lovelace MD Signature Date/Time: 04/01/2024/11:57:16 AM    Final    MR BRAIN WO CONTRAST Result Date: 03/31/2024 EXAM: MRI BRAIN WITHOUT CONTRAST 03/31/2024 03:23:44 AM TECHNIQUE: Multiplanar multisequence MRI of the head/brain was performed without the administration of intravenous contrast. COMPARISON: MRI head 05/03/2023 CLINICAL HISTORY: Neuro deficit, acute, stroke suspected FINDINGS: BRAIN AND VENTRICLES: No acute infarct. No intracranial hemorrhage. No mass. No midline shift. No hydrocephalus. The sella is unremarkable. Normal flow voids. ORBITS: No significant abnormality. SINUSES AND MASTOIDS: No  significant abnormality. BONES AND SOFT TISSUES: Normal marrow signal. No soft tissue abnormality. IMPRESSION: 1. No acute abnormality. Electronically signed by: Glendia Molt MD 03/31/2024 03:41 AM EST RP Workstation: [id number]   US  Venous Img Lower Bilateral Result Date: 03/30/2024 EXAM: ULTRASOUND DUPLEX OF THE BILATERAL LOWER EXTREMITY VEINS TECHNIQUE: Duplex ultrasound using B-mode/gray scaled imaging and Doppler spectral analysis and color flow was obtained of the deep venous structures of the bilateral lower extremity. COMPARISON: None available. CLINICAL HISTORY: R > L new swelling, warmth. eval DVT FINDINGS: RIGHT: The common femoral vein, femoral vein, popliteal vein, and posterior tibial vein demonstrate normal compressibility with normal color flow and spectral analysis. 3.7 x 1.3 x 4.0 cm right popliteal fossa cyst. LEFT: The common femoral vein, femoral vein, popliteal vein, and posterior tibial vein demonstrate normal  compressibility with normal color flow and spectral analysis. IMPRESSION: 1. No deep venous thrombosis in the bilateral lower extremity. 2. 3.7 x 1.3 x 4.0 cm right popliteal fossa cyst. Electronically signed by: Pinkie Pebbles MD 03/30/2024 11:00 PM EST RP Workstation: [id number]   DG Chest 2 View Result Date: 03/30/2024 EXAM: 2 VIEW(S) XRAY OF THE CHEST 03/30/2024 05:17:00 PM COMPARISON: None available. CLINICAL HISTORY: Chest pain Chest pain chest pain chest pain FINDINGS: LUNGS AND PLEURA: No focal pulmonary opacity. No pleural effusion. No pneumothorax. HEART AND MEDIASTINUM: No acute abnormality of the cardiac and mediastinal silhouettes. BONES AND SOFT TISSUES: No acute osseous abnormality. IMPRESSION: 1. No acute process. Electronically signed by: Kevin Dover MD 03/30/2024 05:19 PM EST RP Workstation: [id number]    Scheduled Meds:  apixaban   2.5 mg Oral BID   colestipol   1 g Oral BID   levothyroxine   100 mcg Oral Q0600   multivitamin  1 tablet Oral Daily   psyllium   1 packet Oral Daily   sodium chloride  flush  3 mL Intravenous Q12H   Varenicline  Tartrate  0.03 mg Nasal Q1200   Continuous Infusions:  magnesium  sulfate bolus IVPB       LOS: 0 days   Norval Bar, MD  [facility]  04/01/2024, 12:22 PM   "

## 2024-04-01 NOTE — TOC Initial Note (Signed)
 Transition of Care [facility]) - Initial/Assessment Note    Patient Details  Name: Brenda Horton MRN: [id number] Date of Birth: December 08, 1929  [facility] CM/SW Contact:    Shasta DELENA Daring, RN Phone Number: 04/01/2024, 6:25 PM  Clinical Narrative:                 RNCM met with patient and daughter.  She is agreeable to home health services.  Search started.  Will follow  Expected Discharge Plan: Home w Home Health Services Barriers to Discharge: Continued Medical Work up   Patient Goals and CMS Choice            Expected Discharge Plan and Services   Discharge Planning Services: CM Consult   Living arrangements for the past 2 months: Single Family Home                                      Prior Living Arrangements/Services Living arrangements for the past 2 months: Single Family Home Lives with:: Self Patient language and need for interpreter reviewed:: Yes Do you feel safe going back to the place where you live?: Yes      Need for Family Participation in Patient Care: Yes (Comment) Care giver support system in place?: Yes (comment)   Criminal Activity/Legal Involvement Pertinent to Current Situation/Hospitalization: No - Comment as needed  Activities of Daily Living   ADL Screening (condition at time of admission) Independently performs ADLs?: Yes (appropriate for developmental age) Is the patient deaf or have difficulty hearing?: No Does the patient have difficulty seeing, even when wearing glasses/contacts?: No Does the patient have difficulty concentrating, remembering, or making decisions?: No  Permission Sought/Granted Permission sought to share information with : Family Supports Permission granted to share information with : Yes, Verbal Permission Granted              Emotional Assessment Appearance:: Appears stated age Attitude/Demeanor/Rapport: Gracious, Engaged Affect (typically observed): Appropriate Orientation: : Oriented to  Self, Oriented to Place, Oriented to  Time, Oriented to Situation Alcohol / Substance Use: Not Applicable Psych Involvement: No (comment)  Admission diagnosis:  New onset atrial fibrillation (HCC) [I48.91] Hypothyroidism, unspecified type [E03.9] Patient Active Problem List   Diagnosis Date Noted   Advanced age 89/21/2026   New onset paroxysmal atrial fibrillation with RVR (HCC) 03/30/2024   Vertigo 03/30/2024   Orthostatic hypotension 03/30/2024   Sinus bradycardia / tachybradysyndrome 03/30/2024   Callus 04/27/2020   Prominent metatarsal head of left foot 04/27/2020   Pincer nail deformity 05/06/2019   Hav (hallux abducto valgus), right 11/23/2018   Colon polyps 08/24/2018   Pain due to onychomycosis of toenails of both feet 08/24/2018   Plantar fasciitis, left 08/24/2018   Hypertension 03/09/2018   Hypothyroidism 03/09/2018   Diarrhea due to malabsorption 11/02/2015   Osteopenia 10/22/2013   PCP:  [facility] Pharmacy:   [id number] [address], [place] - [address] [place] [address] Phone: [phone] Fax: [phone]  [facility] - [place], [place] - [address] [facility] AT Portal to Registered [facility] [address] [place] [address] Phone: [phone] Fax: [phone]     Social Drivers of Health (SDOH) Social History: SDOH Screenings   Food Insecurity: No Food Insecurity (03/31/2024)  Housing: Low Risk (03/31/2024)  Transportation Needs: No Transportation Needs (03/31/2024)  Utilities: Not At Risk (03/31/2024)  Financial Resource Strain: Low Risk  (03/16/2024)   Received from [facility]  Social Connections: Socially Isolated (03/31/2024)  Tobacco Use: Low Risk (03/30/2024)   SDOH Interventions:     Readmission Risk Interventions     No data to display

## 2024-04-01 NOTE — Plan of Care (Signed)
  Problem: Education: Goal: Knowledge of condition and prescribed therapy will improve Outcome: Progressing   Problem: Physical Regulation: Goal: Complications related to the disease process, condition or treatment will be avoided or minimized Outcome: Progressing   Problem: Education: Goal: Knowledge of General Education information will improve Description: Including pain rating scale, medication(s)/side effects and non-pharmacologic comfort measures Outcome: Progressing

## 2024-04-01 NOTE — Progress Notes (Addendum)
" °   04/01/24 9486  Notify: Charge Nurse/RN  Name of Charge Nurse/RN Notified Elenor RN  Provider Notification  Provider Name/Title Foust NP  Date Provider Notified 04/01/24  Time Provider Notified [phone]  Method of Notification Page  Notification Reason Critical Result  Provider response See new orders  Date of Provider Response 04/01/24  Time of Provider Response 0522   Second lab draw for potassium 2.6. mmol/L Foust NP notified.  "

## 2024-04-01 NOTE — Progress Notes (Signed)
 Mobility Specialist - Progress Note ON Room Air  Pre-mobility: HR-61, BP-154/39, SpO2-95%  During mobility: HR-63, BP-167/48, SpO2-96%   04/01/24 1500  Mobility  Activity Ambulated with assistance  Level of Assistance Standby assist, set-up cues, supervision of patient - no hands on  Distance Ambulated (ft) 50 ft  Range of Motion/Exercises All extremities  Activity Response Tolerated well  Mobility visit 1 Mobility  Mobility Specialist Start Time (ACUTE ONLY) 1356  Mobility Specialist Stop Time (ACUTE ONLY) 1430  Mobility Specialist Time Calculation (min) (ACUTE ONLY) 34 min   Pt was supine in bed with the HOB elevated on RA and guest in the room upon entry. Pt agreed to mobility. Pt is able today to get to the EOB independently with bed features. Pt is able today to STS with minA CGA and 2 WW. Pt ambulated well throughout activity. Pt does need recovery breaks throughout activity. After activity pt returned to the room back in bed with the needs in reach and guest in the room upon exit.  Clem Rodes Mobility Specialist 04/01/24, 3:06 PM

## 2024-04-01 NOTE — Discharge Instructions (Signed)

## 2024-04-01 NOTE — Care Management Obs Status (Signed)
 MEDICARE OBSERVATION STATUS NOTIFICATION   Patient Details  Name: Brenda Horton MRN: [id number] Date of Birth: Mar 05, 1930   Medicare Observation Status Notification Given:       Kabe Mckoy W, CMA 04/01/2024, 1:03 PM

## 2024-04-01 NOTE — Progress Notes (Addendum)
 " [facility] CLINIC CARDIOLOGY PROGRESS NOTE       Patient ID: Brenda Horton MRN: [id number] DOB/AGE: 03/20/1929 89 y.o.  Admit date: 03/30/2024 Referring Physician Dr. Delayne Solian Primary Physician [facility]  Primary Cardiologist None Reason for Consultation New onset AF RVR  HPI: Brenda Horton is a 89 y.o. female  with a past medical history of hypertension, hypothyroidism who presented to the ED on 03/30/2024 for palpitations and intermittent dizziness. Noted to be in AF RVR on initial EKG which she has no hx of. Cardiology was consulted for further evaluation.   Interval history: - Patient seen and examined this morning, son at bedside.  She is resting comfortably in hospital bed. - Remains in sinus bradycardia on telemetry with heart rate in the 50s.  Denies any dizziness, lightheadedness, palpitations. - Echo should get done this a.m.  Review of systems complete and found to be negative unless listed above    Past Medical History:  Diagnosis Date   CKD (chronic kidney disease) stage 3, GFR 30-59 ml/min (HCC) 03/09/2018   Hypertension    Macular degeneration    Thyroid  disease     Past Surgical History:  Procedure Laterality Date   ABDOMINAL HYSTERECTOMY     Partial   EYE SURGERY     FRACTURE SURGERY      Medications Prior to Admission  Medication Sig Dispense Refill Last Dose/Taking   Bisoprolol Fumarate 2.5 MG TABS Take 1 tablet by mouth daily.   03/30/2024   colestipol  (COLESTID ) 1 g tablet Take 4 tablets (4 g total) by mouth 2 (two) times daily. 240 tablet 2 03/30/2024   levothyroxine  (SYNTHROID ) 112 MCG tablet Take 1 tablet (112 mcg total) by mouth daily before breakfast. 90 tablet 1 03/30/2024 Morning   Multiple Vitamin (MULTIVITAMIN) capsule Take 1 capsule by mouth daily.    Taking   Multiple Vitamins-Minerals (PRESERVISION AREDS 2) CAPS Take 2 capsules by mouth daily.    03/30/2024   Probiotic Product (ALIGN) 4 MG CAPS Take 4 mg by mouth daily.   Past  Month   psyllium (METAMUCIL) 58.6 % packet Take 1 packet by mouth daily.    03/30/2024   RESTASIS 0.05 % ophthalmic emulsion Place 1 drop into both eyes 2 (two) times daily.    03/30/2024   TYRVAYA  0.03 MG/ACT SOLN Place 0.03 mg into the nose daily at 12 noon.   Taking   Coenzyme Q10 10 MG capsule Take 10 mg by mouth daily.  (Patient not taking: Reported on 03/31/2024)   Not Taking   diclofenac  (VOLTAREN ) 25 MG EC tablet Take 1 tablet (25 mg total) by mouth 2 (two) times daily. (Patient not taking: Reported on 03/31/2024) 30 tablet 1 Not Taking   lisinopril -hydrochlorothiazide  (ZESTORETIC ) 20-12.5 MG tablet Take 1 tablet by mouth daily. (Patient not taking: Reported on 03/31/2024) 90 tablet 1 Not Taking   Social History   Socioeconomic History   Marital status: Widowed    Spouse name: Not on file   Number of children: 5   Years of education: Not on file   Highest education level: Some college, no degree  Occupational History   Occupation: retired  Tobacco Use   Smoking status: Never   Smokeless tobacco: Never  Vaping Use   Vaping status: Never Used  Substance and Sexual Activity   Alcohol use: Yes    Comment: maybe 2 glasses of wine a month   Drug use: Never   Sexual activity: Not on file  Other Topics  Concern   Not on file  Social History Narrative   Not on file   Social Drivers of Health   Tobacco Use: Low Risk (03/30/2024)   Patient History    Smoking Tobacco Use: Never    Smokeless Tobacco Use: Never    Passive Exposure: Not on file  Financial Resource Strain: Low Risk  (03/16/2024)   Received from [facility]   Overall Financial Resource Strain (CARDIA)    Difficulty of Paying Living Expenses: Not hard at all  Food Insecurity: No Food Insecurity (03/31/2024)   Epic    Worried About Running Out of Food in the Last Year: Never true    Ran Out of Food in the Last Year: Never true  Transportation Needs: No Transportation Needs (03/31/2024)   Epic    Lack of  Transportation (Medical): No    Lack of Transportation (Non-Medical): No  Physical Activity: Not on file  Stress: Not on file  Social Connections: Socially Isolated (03/31/2024)   Social Connection and Isolation Panel    Frequency of Communication with Friends and Family: More than three times a week    Frequency of Social Gatherings with Friends and Family: More than three times a week    Attends Religious Services: Never    Database Administrator or Organizations: No    Attends Banker Meetings: Never    Marital Status: Widowed  Intimate Partner Violence: Not At Risk (03/31/2024)   Epic    Fear of Current or Ex-Partner: No    Emotionally Abused: No    Physically Abused: No    Sexually Abused: No  Depression (PHQ2-9): Not on file  Alcohol Screen: Not on file  Housing: Low Risk (03/31/2024)   Epic    Unable to Pay for Housing in the Last Year: No    Number of Times Moved in the Last Year: 0    Homeless in the Last Year: No  Utilities: Not At Risk (03/31/2024)   Epic    Threatened with loss of utilities: No  Health Literacy: Not on file    Family History  Problem Relation Age of Onset   Hyperthyroidism Mother    Hypertension Father    Hyperthyroidism Daughter    Arthritis Daughter    Rheum arthritis Daughter    Hyperthyroidism Son      Vitals:   03/31/24 2210 03/31/24 2324 04/01/24 0500 04/01/24 0800  BP:  139/75  (!) 167/51  Pulse:  66  (!) 53  Resp:  18  18  Temp:  99 F (37.2 C)  98.3 F (36.8 C)  TempSrc:  Oral  Oral  SpO2:    96%  Weight: 62.2 kg  62.2 kg   Height:        PHYSICAL EXAM General: Well-appearing elderly female, well nourished, in no acute distress. HEENT: Normocephalic and atraumatic. Neck: No JVD.  Lungs: Normal respiratory effort on room air. Clear bilaterally to auscultation. No wheezes, crackles, rhonchi.  Heart: HRRR. Normal S1 and S2 without gallops or murmurs.  Abdomen: Non-distended appearing.  Msk: Normal strength and  tone for age. Extremities: Warm and well perfused. No clubbing, cyanosis.  No edema.  Neuro: Alert and oriented X 3. Psych: Answers questions appropriately.   Labs: Basic Metabolic Panel: Recent Labs    03/30/24 1614 04/01/24 0401 04/01/24 0404 04/01/24 0548  NA 145  --  142 141  K 3.5  --  2.6* 2.6*  CL 109  --  110  109  CO2 25  --  23 22  GLUCOSE 92  --  89 91  BUN 17  --  14 13  CREATININE 0.80  --  0.75 0.72  CALCIUM 9.2  --  8.3* 8.2*  MG 1.9 1.6*  --   --    Liver Function Tests: Recent Labs    04/01/24 0548  AST 14*  ALT 11  ALKPHOS 27*  BILITOT 0.5  PROT 5.3*  ALBUMIN 3.3*   No results for input(s): LIPASE, AMYLASE in the last 72 hours. CBC: Recent Labs    03/30/24 1614 04/01/24 0404  WBC 6.0 6.3  HGB 11.2* 9.4*  HCT 34.4* 28.7*  MCV 104.6* 103.6*  PLT 216 159   Cardiac Enzymes: No results for input(s): CKTOTAL, CKMB, CKMBINDEX, TROPONINIHS in the last 72 hours. BNP: No results for input(s): BNP in the last 72 hours. D-Dimer: No results for input(s): DDIMER in the last 72 hours. Hemoglobin A1C: No results for input(s): HGBA1C in the last 72 hours. Fasting Lipid Panel: No results for input(s): CHOL, HDL, LDLCALC, TRIG, CHOLHDL, LDLDIRECT in the last 72 hours. Thyroid  Function Tests: No results for input(s): TSH, T4TOTAL, T3FREE, THYROIDAB in the last 72 hours.  Invalid input(s): FREET3 Anemia Panel: No results for input(s): VITAMINB12, FOLATE, FERRITIN, TIBC, IRON, RETICCTPCT in the last 72 hours.   Radiology: MR BRAIN WO CONTRAST Result Date: 03/31/2024 EXAM: MRI BRAIN WITHOUT CONTRAST 03/31/2024 03:23:44 AM TECHNIQUE: Multiplanar multisequence MRI of the head/brain was performed without the administration of intravenous contrast. COMPARISON: MRI head 05/03/2023 CLINICAL HISTORY: Neuro deficit, acute, stroke suspected FINDINGS: BRAIN AND VENTRICLES: No acute infarct. No intracranial hemorrhage.  No mass. No midline shift. No hydrocephalus. The sella is unremarkable. Normal flow voids. ORBITS: No significant abnormality. SINUSES AND MASTOIDS: No significant abnormality. BONES AND SOFT TISSUES: Normal marrow signal. No soft tissue abnormality. IMPRESSION: 1. No acute abnormality. Electronically signed by: Glendia Molt MD 03/31/2024 03:41 AM EST RP Workstation: [id number]   US  Venous Img Lower Bilateral Result Date: 03/30/2024 EXAM: ULTRASOUND DUPLEX OF THE BILATERAL LOWER EXTREMITY VEINS TECHNIQUE: Duplex ultrasound using B-mode/gray scaled imaging and Doppler spectral analysis and color flow was obtained of the deep venous structures of the bilateral lower extremity. COMPARISON: None available. CLINICAL HISTORY: R > L new swelling, warmth. eval DVT FINDINGS: RIGHT: The common femoral vein, femoral vein, popliteal vein, and posterior tibial vein demonstrate normal compressibility with normal color flow and spectral analysis. 3.7 x 1.3 x 4.0 cm right popliteal fossa cyst. LEFT: The common femoral vein, femoral vein, popliteal vein, and posterior tibial vein demonstrate normal compressibility with normal color flow and spectral analysis. IMPRESSION: 1. No deep venous thrombosis in the bilateral lower extremity. 2. 3.7 x 1.3 x 4.0 cm right popliteal fossa cyst. Electronically signed by: Pinkie Pebbles MD 03/30/2024 11:00 PM EST RP Workstation: [id number]   DG Chest 2 View Result Date: 03/30/2024 EXAM: 2 VIEW(S) XRAY OF THE CHEST 03/30/2024 05:17:00 PM COMPARISON: None available. CLINICAL HISTORY: Chest pain Chest pain chest pain chest pain FINDINGS: LUNGS AND PLEURA: No focal pulmonary opacity. No pleural effusion. No pneumothorax. HEART AND MEDIASTINUM: No acute abnormality of the cardiac and mediastinal silhouettes. BONES AND SOFT TISSUES: No acute osseous abnormality. IMPRESSION: 1. No acute process. Electronically signed by: Franky Crease MD 03/30/2024 05:19 PM EST RP Workstation: [id number]    ECHO  ordered  TELEMETRY (personally reviewed): Sinus bradycardia rate 50s  EKG (personally reviewed): Atrial fibrillation RVR rate 111 bpm  Data reviewed by me  04/01/2024: last 24h vitals tele labs imaging I/O ED provider note, admission H&P, hospitalist progress note  Principal Problem:   New onset paroxysmal atrial fibrillation with RVR (HCC) Active Problems:   Hypertension   Hypothyroidism   Vertigo   Orthostatic hypotension   Sinus bradycardia / tachybradysyndrome   Advanced age    ASSESSMENT AND PLAN:  Brenda Horton is a 89 y.o. female  with a past medical history of hypertension, hypothyroidism who presented to the ED on 03/30/2024 for palpitations and intermittent dizziness. Noted to be in AF RVR on initial EKG which she has no hx of. Cardiology was consulted for further evaluation.   # New onset atrial fibrillation # Paroxysmal atrial fibrillation # Orthostatic hypotension Patient presented with dizziness, palpitations. Found to be in new onset atrial fibrillation, converted back to NSR without intervention. Also noted to have orthostatic hypotension. Recent issues with dizziness and home BP meds had been adjusted.  - Will consider some low dose metoprolol  succinate 12.5 mg, but unclear if her heart rate will tolerate this. Would defer restarting home losartan.  - Continue low dose eliquis  for stroke risk reduction given age and recent issues with dizziness/falls. - Monitor and replenish electrolytes for a goal K >4, Mag >2   - Echo ordered, further recommendations pending these results - Minimal and flat troponin most consistent with demand/supply mismatch and not ACS.  No plan for further cardiac invasive procedures.   This patient's plan of care was discussed and created with Dr. Florencio and he is in agreement.  Signed: Danita Bloch, PA-C  04/01/2024, 8:19 AM [facility] Cardiology      "

## 2024-04-02 ENCOUNTER — Other Ambulatory Visit: Payer: Self-pay

## 2024-04-02 DIAGNOSIS — E876 Hypokalemia: Secondary | ICD-10-CM

## 2024-04-02 LAB — MAGNESIUM: Magnesium: 2 mg/dL (ref 1.7–2.4)

## 2024-04-02 LAB — BASIC METABOLIC PANEL WITH GFR
Anion gap: 11 (ref 5–15)
BUN: 16 mg/dL (ref 8–23)
CO2: 21 mmol/L — ABNORMAL LOW (ref 22–32)
Calcium: 8.4 mg/dL — ABNORMAL LOW (ref 8.9–10.3)
Chloride: 108 mmol/L (ref 98–111)
Creatinine, Ser: 0.76 mg/dL (ref 0.44–1.00)
GFR, Estimated: >60 mL/min
Glucose, Bld: 78 mg/dL (ref 70–99)
Potassium: 3.1 mmol/L — ABNORMAL LOW (ref 3.5–5.1)
Sodium: 140 mmol/L (ref 135–145)

## 2024-04-02 MED ORDER — COLESTIPOL HCL 1 G PO TABS
1.0000 g | ORAL_TABLET | Freq: Two times a day (BID) | ORAL | 2 refills | Status: AC
  Filled 2024-04-02: qty 60, 30d supply, fill #0

## 2024-04-02 MED ORDER — METOPROLOL TARTRATE 25 MG PO TABS
25.0000 mg | ORAL_TABLET | Freq: Four times a day (QID) | ORAL | 0 refills | Status: AC | PRN
  Filled 2024-04-02: qty 30, 8d supply, fill #0

## 2024-04-02 MED ORDER — POTASSIUM CHLORIDE CRYS ER 20 MEQ PO TBCR
40.0000 meq | EXTENDED_RELEASE_TABLET | Freq: Two times a day (BID) | ORAL | 0 refills | Status: AC
  Filled 2024-04-02: qty 30, 8d supply, fill #0

## 2024-04-02 MED ORDER — APIXABAN 2.5 MG PO TABS
2.5000 mg | ORAL_TABLET | Freq: Two times a day (BID) | ORAL | 0 refills | Status: AC
  Filled 2024-04-02: qty 60, 30d supply, fill #0

## 2024-04-02 MED ORDER — POTASSIUM CHLORIDE CRYS ER 20 MEQ PO TBCR
40.0000 meq | EXTENDED_RELEASE_TABLET | Freq: Two times a day (BID) | ORAL | Status: DC
  Administered 2024-04-02: 40 meq via ORAL
  Filled 2024-04-02: qty 2

## 2024-04-02 MED ORDER — METOPROLOL TARTRATE 25 MG PO TABS: 25.0000 mg | ORAL_TABLET | Freq: Four times a day (QID) | ORAL | Status: DC | PRN

## 2024-04-02 NOTE — Plan of Care (Signed)
  Problem: Physical Regulation: Goal: Complications related to the disease process, condition or treatment will be avoided or minimized Outcome: Progressing   Problem: Education: Goal: Knowledge of General Education information will improve Description: Including pain rating scale, medication(s)/side effects and non-pharmacologic comfort measures Outcome: Progressing

## 2024-04-02 NOTE — Progress Notes (Signed)
 Reviewed discharge instructions with pt and sons. Both verbalize understanding. IV removed Tele removed. PT has belongings

## 2024-04-02 NOTE — TOC Transition Note (Signed)
 Transition of Care [facility]) - Discharge Note   Patient Details  Name: Brenda Horton MRN: [id number] Date of Birth: 03-14-1929  Transition of [facility]) CM/SW Contact:  Brenda JAYSON Carpen, Brenda Horton Phone Number: 04/02/2024, 12:06 PM   Clinical Narrative:   Patient has orders to discharge home today. Reviewed home health offers. Patient and son selected [facility]. Liaison is aware. Patient is agreeable to RW but declined 3-in-1. She already has an elevated toilet and shower chair at home. CSW ordered RW through [employer]. No further concerns. CSW signing off.  Final next level of care: Home w Home Health Services Barriers to Discharge: Barriers Resolved   Patient Goals and CMS Choice   CMS Medicare.gov Compare Post Acute Care list provided to:: Patient (Son) Choice offered to / list presented to : Patient, Adult Children      Discharge Placement                Patient to be transferred to facility by: Son Name of family member notified: Brenda Horton Patient and family notified of of transfer: 04/02/24  Discharge Plan and Services Additional resources added to the After Visit Summary for     Discharge Planning Services: CM Consult            DME Arranged: Vannie rolling DME Agency: [employer] Date DME Agency Contacted: 04/02/24   Representative spoke with at DME Agency: [facility] HH Arranged: PT, OT, Nurse's Aide HH Agency: [facility] Date [facility] Agency Contacted: 04/02/24      Social Drivers of Health (SDOH) Interventions SDOH Screenings   Food Insecurity: No Food Insecurity (03/31/2024)  Housing: Low Risk (03/31/2024)  Transportation Needs: No Transportation Needs (03/31/2024)  Utilities: Not At Risk (03/31/2024)  Financial Resource Strain: Low Risk  (03/16/2024)   Received from [facility]  Social Connections: Socially Isolated (03/31/2024)  Tobacco Use: Low Risk (03/30/2024)     Readmission Risk Interventions     No data to display

## 2024-04-02 NOTE — Progress Notes (Signed)
 " [facility] CLINIC CARDIOLOGY PROGRESS NOTE       Patient ID: Brenda Horton MRN: [id number] DOB/AGE: 1930/02/06 89 y.o.  Admit date: 03/30/2024 Referring Physician Dr. Delayne Solian Primary Physician [facility]  Primary Cardiologist None Reason for Consultation New onset AF RVR  HPI: Brenda Horton is a 89 y.o. female  with a past medical history of hypertension, hypothyroidism who presented to the ED on 03/30/2024 for palpitations and intermittent dizziness. Noted to be in AF RVR on initial EKG which she has no hx of. Cardiology was consulted for further evaluation.   Interval history: - Patient seen and examined this morning, no family at bedside.  She is resting comfortably in hospital bed. - Remains in sinus bradycardia on telemetry with heart rate in the 50s-60s.  Denies any dizziness, lightheadedness, palpitations. - Echo results reviewed with patient.   Review of systems complete and found to be negative unless listed above    Past Medical History:  Diagnosis Date   CKD (chronic kidney disease) stage 3, GFR 30-59 ml/min (HCC) 03/09/2018   Hypertension    Macular degeneration    Thyroid  disease     Past Surgical History:  Procedure Laterality Date   ABDOMINAL HYSTERECTOMY     Partial   EYE SURGERY     FRACTURE SURGERY      Medications Prior to Admission  Medication Sig Dispense Refill Last Dose/Taking   Bisoprolol Fumarate 2.5 MG TABS Take 1 tablet by mouth daily.   03/30/2024   colestipol  (COLESTID ) 1 g tablet Take 4 tablets (4 g total) by mouth 2 (two) times daily. 240 tablet 2 03/30/2024   levothyroxine  (SYNTHROID ) 112 MCG tablet Take 1 tablet (112 mcg total) by mouth daily before breakfast. 90 tablet 1 03/30/2024 Morning   Multiple Vitamin (MULTIVITAMIN) capsule Take 1 capsule by mouth daily.    Taking   Multiple Vitamins-Minerals (PRESERVISION AREDS 2) CAPS Take 2 capsules by mouth daily.    03/30/2024   Probiotic Product (ALIGN) 4 MG CAPS Take 4 mg by mouth  daily.   Past Month   psyllium (METAMUCIL) 58.6 % packet Take 1 packet by mouth daily.    03/30/2024   TYRVAYA  0.03 MG/ACT SOLN Place 0.03 mg into the nose daily at 12 noon.   Taking   Coenzyme Q10 10 MG capsule Take 10 mg by mouth daily.  (Patient not taking: Reported on 03/31/2024)   Not Taking   diclofenac  (VOLTAREN ) 25 MG EC tablet Take 1 tablet (25 mg total) by mouth 2 (two) times daily. (Patient not taking: Reported on 03/31/2024) 30 tablet 1 Not Taking   lisinopril -hydrochlorothiazide  (ZESTORETIC ) 20-12.5 MG tablet Take 1 tablet by mouth daily. (Patient not taking: Reported on 03/31/2024) 90 tablet 1 Not Taking   RESTASIS 0.05 % ophthalmic emulsion Place 1 drop into both eyes 2 (two) times daily.  (Patient not taking: Reported on 04/01/2024)   Not Taking   Social History   Socioeconomic History   Marital status: Widowed    Spouse name: Not on file   Number of children: 5   Years of education: Not on file   Highest education level: Some college, no degree  Occupational History   Occupation: retired  Tobacco Use   Smoking status: Never   Smokeless tobacco: Never  Vaping Use   Vaping status: Never Used  Substance and Sexual Activity   Alcohol use: Yes    Comment: maybe 2 glasses of wine a month   Drug use: Never  Sexual activity: Not on file  Other Topics Concern   Not on file  Social History Narrative   Not on file   Social Drivers of Health   Tobacco Use: Low Risk (03/30/2024)   Patient History    Smoking Tobacco Use: Never    Smokeless Tobacco Use: Never    Passive Exposure: Not on file  Financial Resource Strain: Low Risk  (03/16/2024)   Received from [facility]   Overall Financial Resource Strain (CARDIA)    Difficulty of Paying Living Expenses: Not hard at all  Food Insecurity: No Food Insecurity (03/31/2024)   Epic    Worried About Running Out of Food in the Last Year: Never true    Ran Out of Food in the Last Year: Never true  Transportation  Needs: No Transportation Needs (03/31/2024)   Epic    Lack of Transportation (Medical): No    Lack of Transportation (Non-Medical): No  Physical Activity: Not on file  Stress: Not on file  Social Connections: Socially Isolated (03/31/2024)   Social Connection and Isolation Panel    Frequency of Communication with Friends and Family: More than three times a week    Frequency of Social Gatherings with Friends and Family: More than three times a week    Attends Religious Services: Never    Database Administrator or Organizations: No    Attends Banker Meetings: Never    Marital Status: Widowed  Intimate Partner Violence: Not At Risk (03/31/2024)   Epic    Fear of Current or Ex-Partner: No    Emotionally Abused: No    Physically Abused: No    Sexually Abused: No  Depression (PHQ2-9): Not on file  Alcohol Screen: Not on file  Housing: Low Risk (03/31/2024)   Epic    Unable to Pay for Housing in the Last Year: No    Number of Times Moved in the Last Year: 0    Homeless in the Last Year: No  Utilities: Not At Risk (03/31/2024)   Epic    Threatened with loss of utilities: No  Health Literacy: Not on file    Family History  Problem Relation Age of Onset   Hyperthyroidism Mother    Hypertension Father    Hyperthyroidism Daughter    Arthritis Daughter    Rheum arthritis Daughter    Hyperthyroidism Son      Vitals:   04/02/24 0010 04/02/24 0402 04/02/24 0522 04/02/24 0837  BP: (!) 154/54 (!) 163/50  (!) 163/63  Pulse: 61 60  (!) 58  Resp: 20 20  18   Temp: 98.1 F (36.7 C) 98.7 F (37.1 C)  98.6 F (37 C)  TempSrc:      SpO2: 96% 93%  97%  Weight:   64.6 kg   Height:        PHYSICAL EXAM General: Well-appearing elderly female, well nourished, in no acute distress. HEENT: Normocephalic and atraumatic. Neck: No JVD.  Lungs: Normal respiratory effort on room air. Clear bilaterally to auscultation. No wheezes, crackles, rhonchi.  Heart: HRRR. Normal S1 and S2  without gallops or murmurs.  Abdomen: Non-distended appearing.  Msk: Normal strength and tone for age. Extremities: Warm and well perfused. No clubbing, cyanosis.  No edema.  Neuro: Alert and oriented X 3. Psych: Answers questions appropriately.   Labs: Basic Metabolic Panel: Recent Labs    04/01/24 1612 04/02/24 0518  NA 140 140  K 3.5 3.1*  CL 109 108  CO2 19*  21*  GLUCOSE 90 78  BUN 14 16  CREATININE 0.82 0.76  CALCIUM 8.3* 8.4*  MG 2.3 2.0   Liver Function Tests: Recent Labs    04/01/24 0548  AST 14*  ALT 11  ALKPHOS 27*  BILITOT 0.5  PROT 5.3*  ALBUMIN 3.3*   No results for input(s): LIPASE, AMYLASE in the last 72 hours. CBC: Recent Labs    03/30/24 1614 04/01/24 0404  WBC 6.0 6.3  HGB 11.2* 9.4*  HCT 34.4* 28.7*  MCV 104.6* 103.6*  PLT 216 159   Cardiac Enzymes: No results for input(s): CKTOTAL, CKMB, CKMBINDEX, TROPONINIHS in the last 72 hours. BNP: No results for input(s): BNP in the last 72 hours. D-Dimer: No results for input(s): DDIMER in the last 72 hours. Hemoglobin A1C: No results for input(s): HGBA1C in the last 72 hours. Fasting Lipid Panel: No results for input(s): CHOL, HDL, LDLCALC, TRIG, CHOLHDL, LDLDIRECT in the last 72 hours. Thyroid  Function Tests: No results for input(s): TSH, T4TOTAL, T3FREE, THYROIDAB in the last 72 hours.  Invalid input(s): FREET3 Anemia Panel: No results for input(s): VITAMINB12, FOLATE, FERRITIN, TIBC, IRON, RETICCTPCT in the last 72 hours.   Radiology: ECHOCARDIOGRAM COMPLETE Result Date: 04/01/2024    ECHOCARDIOGRAM REPORT   Patient Name:   TYNESHA FREE Date of Exam: 04/01/2024 Medical Rec #:  [id number]      Height:       60.0 in Accession #:    [id number]     Weight:       137.1 lb Date of Birth:  [birth year]      BSA:          1.590 m Patient Age:    89 years       BP:           139/75 mmHg Patient Gender: F              HR:           66 bpm. Exam  Location:  [facility] Procedure: 2D Echo, Cardiac Doppler and Color Doppler (Both Spectral and Color            Flow Doppler were utilized during procedure). Indications:     Syncope R55  History:         Patient has no prior history of Echocardiogram examinations.                  Risk Factors:Hypertension.  Sonographer:     Christopher Furnace Referring Phys:  [id number] SUMAYYA AMIN Diagnosing Phys: Dwayne D Callwood MD IMPRESSIONS  1. Left ventricular ejection fraction, by estimation, is 55 to 60%. The left ventricle has normal function. The left ventricle has no regional wall motion abnormalities. Left ventricular diastolic parameters are consistent with Grade I diastolic dysfunction (impaired relaxation).  2. Right ventricular systolic function is normal. The right ventricular size is normal.  3. Left atrial size was mild to moderately dilated.  4. The mitral valve is grossly normal. Mild to moderate mitral valve regurgitation.  5. The aortic valve is grossly normal. Aortic valve regurgitation is not visualized. FINDINGS  Left Ventricle: Left ventricular ejection fraction, by estimation, is 55 to 60%. The left ventricle has normal function. The left ventricle has no regional wall motion abnormalities. Strain was performed and the global longitudinal strain is indeterminate. The left ventricular internal cavity size was normal in size. There is no left ventricular hypertrophy. Left ventricular diastolic parameters are consistent with Grade I diastolic dysfunction (impaired relaxation). Right  Ventricle: The right ventricular size is normal. No increase in right ventricular wall thickness. Right ventricular systolic function is normal. Left Atrium: Left atrial size was mild to moderately dilated. Right Atrium: Right atrial size was normal in size. Pericardium: There is no evidence of pericardial effusion. Mitral Valve: The mitral valve is grossly normal. Mild to moderate mitral valve regurgitation. MV peak gradient, 6.9 mmHg. The  mean mitral valve gradient is 3.0 mmHg. Tricuspid Valve: The tricuspid valve is normal in structure. Tricuspid valve regurgitation is mild. Aortic Valve: The aortic valve is grossly normal. Aortic valve regurgitation is not visualized. Aortic valve mean gradient measures 6.0 mmHg. Aortic valve peak gradient measures 11.3 mmHg. Aortic valve area, by VTI measures 2.34 cm. Pulmonic Valve: The pulmonic valve was not well visualized. Pulmonic valve regurgitation is not visualized. Aorta: The aortic root was not well visualized. IAS/Shunts: No atrial level shunt detected by color flow Doppler. Additional Comments: 3D was performed not requiring image post processing on an independent workstation and was normal.  LEFT VENTRICLE PLAX 2D LVIDd:         4.20 cm   Diastology LVIDs:         3.00 cm   LV e' medial:    7.40 cm/s LV PW:         0.90 cm   LV E/e' medial:  12.0 LV IVS:        0.90 cm   LV e' lateral:   9.79 cm/s LVOT diam:     2.00 cm   LV E/e' lateral: 9.1 LV SV:         95 LV SV Index:   60 LVOT Area:     3.14 cm  RIGHT VENTRICLE RV Basal diam:  2.80 cm RV Mid diam:    2.30 cm LEFT ATRIUM              Index        RIGHT ATRIUM           Index LA diam:        3.30 cm  2.08 cm/m   RA Area:     11.70 cm LA Vol (A2C):   101.0 ml 63.52 ml/m  RA Volume:   25.30 ml  15.91 ml/m LA Vol (A4C):   53.9 ml  33.90 ml/m LA Biplane Vol: 80.8 ml  50.82 ml/m  AORTIC VALVE AV Area (Vmax):    2.19 cm AV Area (Vmean):   2.15 cm AV Area (VTI):     2.34 cm AV Vmax:           168.00 cm/s AV Vmean:          112.000 cm/s AV VTI:            0.405 m AV Peak Grad:      11.3 mmHg AV Mean Grad:      6.0 mmHg LVOT Vmax:         117.00 cm/s LVOT Vmean:        76.500 cm/s LVOT VTI:          0.302 m LVOT/AV VTI ratio: 0.75  AORTA Ao Root diam: 3.30 cm MITRAL VALVE MV Area (PHT): 4.10 cm     SHUNTS MV Area VTI:   2.17 cm     Systemic VTI:  0.30 m MV Peak grad:  6.9 mmHg     Systemic Diam: 2.00 cm MV Mean grad:  3.0 mmHg MV Vmax:  1.31 m/s MV Vmean:      76.7 cm/s MV Decel Time: 185 msec MV E velocity: 88.60 cm/s MV A velocity: 115.00 cm/s MV E/A ratio:  0.77 Cara JONETTA Lovelace MD Electronically signed by Cara JONETTA Lovelace MD Signature Date/Time: 04/01/2024/11:57:16 AM    Final    MR BRAIN WO CONTRAST Result Date: 03/31/2024 EXAM: MRI BRAIN WITHOUT CONTRAST 03/31/2024 03:23:44 AM TECHNIQUE: Multiplanar multisequence MRI of the head/brain was performed without the administration of intravenous contrast. COMPARISON: MRI head 05/03/2023 CLINICAL HISTORY: Neuro deficit, acute, stroke suspected FINDINGS: BRAIN AND VENTRICLES: No acute infarct. No intracranial hemorrhage. No mass. No midline shift. No hydrocephalus. The sella is unremarkable. Normal flow voids. ORBITS: No significant abnormality. SINUSES AND MASTOIDS: No significant abnormality. BONES AND SOFT TISSUES: Normal marrow signal. No soft tissue abnormality. IMPRESSION: 1. No acute abnormality. Electronically signed by: Glendia Molt MD 03/31/2024 03:41 AM EST RP Workstation: [id number]   US  Venous Img Lower Bilateral Result Date: 03/30/2024 EXAM: ULTRASOUND DUPLEX OF THE BILATERAL LOWER EXTREMITY VEINS TECHNIQUE: Duplex ultrasound using B-mode/gray scaled imaging and Doppler spectral analysis and color flow was obtained of the deep venous structures of the bilateral lower extremity. COMPARISON: None available. CLINICAL HISTORY: R > L new swelling, warmth. eval DVT FINDINGS: RIGHT: The common femoral vein, femoral vein, popliteal vein, and posterior tibial vein demonstrate normal compressibility with normal color flow and spectral analysis. 3.7 x 1.3 x 4.0 cm right popliteal fossa cyst. LEFT: The common femoral vein, femoral vein, popliteal vein, and posterior tibial vein demonstrate normal compressibility with normal color flow and spectral analysis. IMPRESSION: 1. No deep venous thrombosis in the bilateral lower extremity. 2. 3.7 x 1.3 x 4.0 cm right popliteal fossa cyst.  Electronically signed by: Pinkie Pebbles MD 03/30/2024 11:00 PM EST RP Workstation: [id number]   DG Chest 2 View Result Date: 03/30/2024 EXAM: 2 VIEW(S) XRAY OF THE CHEST 03/30/2024 05:17:00 PM COMPARISON: None available. CLINICAL HISTORY: Chest pain Chest pain chest pain chest pain FINDINGS: LUNGS AND PLEURA: No focal pulmonary opacity. No pleural effusion. No pneumothorax. HEART AND MEDIASTINUM: No acute abnormality of the cardiac and mediastinal silhouettes. BONES AND SOFT TISSUES: No acute osseous abnormality. IMPRESSION: 1. No acute process. Electronically signed by: Franky Crease MD 03/30/2024 05:19 PM EST RP Workstation: [id number]    ECHO as above  TELEMETRY (personally reviewed): Sinus bradycardia rate 50s-60s  EKG (personally reviewed): Atrial fibrillation RVR rate 111 bpm  Data reviewed by me 04/02/2024: last 24h vitals tele labs imaging I/O ED provider note, admission H&P, hospitalist progress note  Principal Problem:   New onset paroxysmal atrial fibrillation with RVR (HCC) Active Problems:   Hypertension   Hypothyroidism   Vertigo   Orthostatic hypotension   Sinus bradycardia / tachybradysyndrome   Advanced age    ASSESSMENT AND PLAN:  Brenda Horton is a 89 y.o. female  with a past medical history of hypertension, hypothyroidism who presented to the ED on 03/30/2024 for palpitations and intermittent dizziness. Noted to be in AF RVR on initial EKG which she has no hx of. Cardiology was consulted for further evaluation.   # New onset atrial fibrillation # Paroxysmal atrial fibrillation # Orthostatic hypotension Patient presented with dizziness, palpitations. Found to be in new onset atrial fibrillation, converted back to NSR without intervention. Also noted to have orthostatic hypotension. Recent issues with dizziness and home BP meds had been adjusted. Echo this admission with EF 55-60%, no WMAs, grade I diastolic dysfunction, mild to moderate MR.  - Ordered PRN  metoprolol   succinate 25 mg for palpitations/AF every 6 hours.  - Continue low dose eliquis  for stroke risk reduction given age and recent issues with dizziness/falls. - Monitor and replenish electrolytes for a goal K >4, Mag >2. Would benefit from PO electrolyte supplementation on DC. - Minimal and flat troponin most consistent with demand/supply mismatch and not ACS.  No plan for further cardiac invasive procedures.  Ok for discharge today from a cardiac perspective. Will arrange for follow up in clinic with Dr. Custovic in 1-2 weeks.    This patient's plan of care was discussed and created with Dr. Florencio and he is in agreement.  Signed: Danita Bloch, PA-C  04/02/2024, 8:37 AM [facility]      "

## 2024-04-02 NOTE — Plan of Care (Signed)
" °  Problem: Education: Goal: Knowledge of condition and prescribed therapy will improve Outcome: Adequate for Discharge   Problem: Cardiac: Goal: Will achieve and/or maintain adequate cardiac output Outcome: Adequate for Discharge   Problem: Physical Regulation: Goal: Complications related to the disease process, condition or treatment will be avoided or minimized 04/02/2024 1219 by Dong Nimmons K, RN Outcome: Adequate for Discharge 04/02/2024 1045 by Freeda Leon POUR, RN Outcome: Progressing   Problem: Education: Goal: Knowledge of General Education information will improve Description: Including pain rating scale, medication(s)/side effects and non-pharmacologic comfort measures 04/02/2024 1219 by Tyee Vandevoorde K, RN Outcome: Adequate for Discharge 04/02/2024 1045 by Freeda Leon POUR, RN Outcome: Progressing   Problem: Health Behavior/Discharge Planning: Goal: Ability to manage health-related needs will improve Outcome: Adequate for Discharge   Problem: Clinical Measurements: Goal: Ability to maintain clinical measurements within normal limits will improve Outcome: Adequate for Discharge Goal: Will remain free from infection Outcome: Adequate for Discharge Goal: Diagnostic test results will improve Outcome: Adequate for Discharge Goal: Respiratory complications will improve Outcome: Adequate for Discharge Goal: Cardiovascular complication will be avoided Outcome: Adequate for Discharge   Problem: Activity: Goal: Risk for activity intolerance will decrease Outcome: Adequate for Discharge   Problem: Nutrition: Goal: Adequate nutrition will be maintained Outcome: Adequate for Discharge   Problem: Coping: Goal: Level of anxiety will decrease Outcome: Adequate for Discharge   Problem: Elimination: Goal: Will not experience complications related to bowel motility Outcome: Adequate for Discharge Goal: Will not experience complications related to urinary  retention Outcome: Adequate for Discharge   Problem: Pain Managment: Goal: General experience of comfort will improve and/or be controlled Outcome: Adequate for Discharge   Problem: Safety: Goal: Ability to remain free from injury will improve Outcome: Adequate for Discharge   Problem: Skin Integrity: Goal: Risk for impaired skin integrity will decrease Outcome: Adequate for Discharge   "

## 2024-04-02 NOTE — Discharge Summary (Addendum)
 " [facility] Physician Discharge Summary   Patient name: Brenda Horton  Admit date:     03/30/2024  Discharge date: 04/02/2024  Attending Physician: CLEATUS DELAYNE GAILS [[id number]]  Discharge Physician: Norval Bar   PCP: [facility]  Admitted From: Home  Disposition:  Home  Recommendations for Outpatient Follow-up:  Follow up repeat blood work on Mon 04/05/24 Take medications as prescribed Follow up with PCP in 1-2 weeks Follow up with cardiology clinic as scheduled by cardiology team  Home Health:Yes Equipment/Devices: @ECDMELIST @  Discharge Condition:Stable CODE STATUS:FULL Diet recommendation: Heart Healthy Fluid Restriction: None  Hospital Summary:  89 y.o. female with PMH of HTN, hypothyroidism and chronic diarrhea who is functionally independent and lives alone who presented with 2 weeks history of intermittent dizziness and palpitations resulting in a fall day prior to admission. Admitted for new onset atrial fibrillation. Patient spontaneously converted back to sinus bradycardia in mid 50s then noted to have positive orthostatic vitals. This was associated with symptoms of weakness and dizziness. Echo showed LVEF 55-60%, no WMAs, grade 1 diastolic dysfunction. Seen by cardiology during admission. Started on eliquis  low dose. Stopped home antihypertensives including bisoprolol. Repeat orthostatic vitals stable. Given oral and IV supplementation for hypokalemia and hypomagnesemia. - started on oral metoprolol  25mg  q6h PRN for palpitations with or without dizziness - started on oral potassium supplementation - given script for repeat lab work to monitor electrolytes - will need to follow up with cardiology clinic, inpatient cardiology team to setup follow up   Discharge Diagnoses:  Principal Problem:   New onset paroxysmal atrial fibrillation with RVR (HCC) Active Problems:   Sinus bradycardia / tachybradysyndrome   Vertigo   Orthostatic hypotension    Hypertension   Hypothyroidism   Advanced age   Discharge Instructions  Discharge Instructions     For home use only DME 4 wheeled rolling walker with seat   Complete by: As directed    Patient needs a walker to treat with the following condition: Orthostatic hypotension   For home use only DME Walker rolling   Complete by: As directed    Walker: With 5 Inch Wheels   Patient needs a walker to treat with the following condition: Orthostatic hypotension   Increase activity slowly   Complete by: As directed    Increase activity slowly   Complete by: As directed       Allergies as of 04/02/2024   No Known Allergies      Medication List     STOP taking these medications    Bisoprolol Fumarate 2.5 MG Tabs   Coenzyme Q10 10 MG capsule   diclofenac  25 MG EC tablet Commonly known as: VOLTAREN    lisinopril -hydrochlorothiazide  20-12.5 MG tablet Commonly known as: ZESTORETIC    Restasis 0.05 % ophthalmic emulsion Generic drug: cycloSPORINE       TAKE these medications    Align 4 MG Caps Take 4 mg by mouth daily.   apixaban  2.5 MG Tabs tablet Commonly known as: ELIQUIS  Take 1 tablet (2.5 mg total) by mouth 2 (two) times daily.   colestipol  1 g tablet Commonly known as: COLESTID  Take 1 tablet (1 g total) by mouth 2 (two) times daily. What changed: how much to take   levothyroxine  112 MCG tablet Commonly known as: SYNTHROID  Take 1 tablet (112 mcg total) by mouth daily before breakfast.   metoprolol  tartrate 25 MG tablet Commonly known as: LOPRESSOR  Take 1 tablet (25 mg total) by mouth every 6 (six) hours as needed (  For episodes of palpitations/atrial fibrillation).   multivitamin capsule Take 1 capsule by mouth daily.   potassium chloride  SA 20 MEQ tablet Commonly known as: KLOR-CON  M Take 2 tablets (40 mEq total) by mouth 2 (two) times daily.   PreserVision AREDS 2 Caps Take 2 capsules by mouth daily.   psyllium 58.6 % packet Commonly known as:  METAMUCIL Take 1 packet by mouth daily.   Tyrvaya  0.03 MG/ACT Soln Generic drug: Varenicline  Tartrate Place 0.03 mg into the nose daily at 12 noon.               Durable Medical Equipment  (From admission, onward)           Start     Ordered   03/31/24 1427  For home use only DME 4 wheeled rolling walker with seat  Once       Question:  Patient needs a walker to treat with the following condition  Answer:  Generalized weakness   03/31/24 1426            Follow-up Information     Custovic, Annalee, DO. Go in 1 week(s).   Specialty: Cardiology Contact information: [address] [place] [address] [phone]                Allergies[1]  Discharge Exam: Vitals:   04/02/24 1017 04/02/24 1019  BP: (!) 140/43 (!) 137/47  Pulse:    Resp:    Temp:    SpO2:      Physical Exam Vitals and nursing note reviewed.  Constitutional:      General: She is not in acute distress.    Appearance: She is ill-appearing.     Comments: Weak, frail  HENT:     Head: Normocephalic and atraumatic.  Cardiovascular:     Rate and Rhythm: Normal rate and regular rhythm.     Pulses: Normal pulses.     Heart sounds: Normal heart sounds.  Pulmonary:     Effort: Pulmonary effort is normal.     Breath sounds: Normal breath sounds.  Abdominal:     General: Bowel sounds are normal.     Palpations: Abdomen is soft.  Skin:    General: Skin is warm and dry.  Neurological:     Mental Status: She is alert. Mental status is at baseline.     The results of significant diagnostics from this hospitalization (including imaging, microbiology, ancillary and laboratory) are listed below for reference.    Microbiology: No results found for this or any previous visit (from the past 240 hours).   Labs: ProBNP, BNP (last 5 results) No results for input(s): PROBNP, BNP in the last 8760 hours. Basic Metabolic Panel: Recent Labs  Lab 03/30/24 1614 04/01/24 0401  04/01/24 0404 04/01/24 0548 04/01/24 1612 04/02/24 0518  NA 145  --  142 141 140 140  K 3.5  --  2.6* 2.6* 3.5 3.1*  CL 109  --  110 109 109 108  CO2 25  --  23 22 19* 21*  GLUCOSE 92  --  89 91 90 78  BUN 17  --  14 13 14 16   CREATININE 0.80  --  0.75 0.72 0.82 0.76  CALCIUM 9.2  --  8.3* 8.2* 8.3* 8.4*  MG 1.9 1.6*  --   --  2.3 2.0   Liver Function Tests: Recent Labs  Lab 04/01/24 0548  AST 14*  ALT 11  ALKPHOS 27*  BILITOT 0.5  PROT 5.3*  ALBUMIN 3.3*   No results for input(s): LIPASE, AMYLASE in the last 168 hours. No results for input(s): AMMONIA in the last 168 hours. CBC: Recent Labs  Lab 03/30/24 1614 04/01/24 0404  WBC 6.0 6.3  HGB 11.2* 9.4*  HCT 34.4* 28.7*  MCV 104.6* 103.6*  PLT 216 159   Cardiac Enzymes: No results for input(s): CKTOTAL, CKMB, CKMBINDEX, TROPONINI, TROPONINIHS in the last 168 hours. BNP: No results for input(s): BNP in the last 168 hours. CBG: Recent Labs  Lab 03/31/24 0606 04/01/24 0553  GLUCAP 88 81   D-Dimer No results for input(s): DDIMER in the last 72 hours. Hgb A1c No results for input(s): HGBA1C in the last 72 hours. Lipid Profile No results for input(s): CHOL, HDL, LDLCALC, TRIG, CHOLHDL, LDLDIRECT in the last 72 hours. Thyroid  function studies No results for input(s): TSH, T4TOTAL, FREET4, T3FREE, THYROIDAB in the last 72 hours.  Invalid input(s): FREET3 Anemia work up No results for input(s): VITAMINB12, FOLATE, FERRITIN, TIBC, IRON, RETICCTPCT in the last 72 hours. Urinalysis    Component Value Date/Time   COLORURINE YELLOW (A) 03/30/2024 2120   APPEARANCEUR CLEAR (A) 03/30/2024 2120   LABSPEC 1.010 03/30/2024 2120   PHURINE 5.0 03/30/2024 2120   GLUCOSEU NEGATIVE 03/30/2024 2120   HGBUR NEGATIVE 03/30/2024 2120   BILIRUBINUR NEGATIVE 03/30/2024 2120   KETONESUR NEGATIVE 03/30/2024 2120   PROTEINUR NEGATIVE 03/30/2024 2120   NITRITE NEGATIVE  03/30/2024 2120   LEUKOCYTESUR NEGATIVE 03/30/2024 2120   Sepsis Labs Recent Labs  Lab 03/30/24 1614 04/01/24 0404  WBC 6.0 6.3    Procedures/Studies: ECHOCARDIOGRAM COMPLETE Result Date: 04/01/2024    ECHOCARDIOGRAM REPORT   Patient Name:   Brenda Horton Date of Exam: 04/01/2024 Medical Rec #:  [id number]      Height:       60.0 in Accession #:    [id number]     Weight:       137.1 lb Date of Birth:  02-08-1930      BSA:          1.590 m Patient Age:    89 years       BP:           139/75 mmHg Patient Gender: F              HR:           66 bpm. Exam Location:  [facility] Procedure: 2D Echo, Cardiac Doppler and Color Doppler (Both Spectral and Color            Flow Doppler were utilized during procedure). Indications:     Syncope R55  History:         Patient has no prior history of Echocardiogram examinations.                  Risk Factors:Hypertension.  Sonographer:     Christopher Furnace Referring Phys:  [id number] SUMAYYA AMIN Diagnosing Phys: Dwayne D Callwood MD IMPRESSIONS  1. Left ventricular ejection fraction, by estimation, is 55 to 60%. The left ventricle has normal function. The left ventricle has no regional wall motion abnormalities. Left ventricular diastolic parameters are consistent with Grade I diastolic dysfunction (impaired relaxation).  2. Right ventricular systolic function is normal. The right ventricular size is normal.  3. Left atrial size was mild to moderately dilated.  4. The mitral valve is grossly normal. Mild to moderate mitral valve regurgitation.  5. The aortic valve is grossly normal. Aortic valve regurgitation is not visualized.  FINDINGS  Left Ventricle: Left ventricular ejection fraction, by estimation, is 55 to 60%. The left ventricle has normal function. The left ventricle has no regional wall motion abnormalities. Strain was performed and the global longitudinal strain is indeterminate. The left ventricular internal cavity size was normal in size. There is no left ventricular  hypertrophy. Left ventricular diastolic parameters are consistent with Grade I diastolic dysfunction (impaired relaxation). Right Ventricle: The right ventricular size is normal. No increase in right ventricular wall thickness. Right ventricular systolic function is normal. Left Atrium: Left atrial size was mild to moderately dilated. Right Atrium: Right atrial size was normal in size. Pericardium: There is no evidence of pericardial effusion. Mitral Valve: The mitral valve is grossly normal. Mild to moderate mitral valve regurgitation. MV peak gradient, 6.9 mmHg. The mean mitral valve gradient is 3.0 mmHg. Tricuspid Valve: The tricuspid valve is normal in structure. Tricuspid valve regurgitation is mild. Aortic Valve: The aortic valve is grossly normal. Aortic valve regurgitation is not visualized. Aortic valve mean gradient measures 6.0 mmHg. Aortic valve peak gradient measures 11.3 mmHg. Aortic valve area, by VTI measures 2.34 cm. Pulmonic Valve: The pulmonic valve was not well visualized. Pulmonic valve regurgitation is not visualized. Aorta: The aortic root was not well visualized. IAS/Shunts: No atrial level shunt detected by color flow Doppler. Additional Comments: 3D was performed not requiring image post processing on an independent workstation and was normal.  LEFT VENTRICLE PLAX 2D LVIDd:         4.20 cm   Diastology LVIDs:         3.00 cm   LV e' medial:    7.40 cm/s LV PW:         0.90 cm   LV E/e' medial:  12.0 LV IVS:        0.90 cm   LV e' lateral:   9.79 cm/s LVOT diam:     2.00 cm   LV E/e' lateral: 9.1 LV SV:         95 LV SV Index:   60 LVOT Area:     3.14 cm  RIGHT VENTRICLE RV Basal diam:  2.80 cm RV Mid diam:    2.30 cm LEFT ATRIUM              Index        RIGHT ATRIUM           Index LA diam:        3.30 cm  2.08 cm/m   RA Area:     11.70 cm LA Vol (A2C):   101.0 ml 63.52 ml/m  RA Volume:   25.30 ml  15.91 ml/m LA Vol (A4C):   53.9 ml  33.90 ml/m LA Biplane Vol: 80.8 ml  50.82 ml/m   AORTIC VALVE AV Area (Vmax):    2.19 cm AV Area (Vmean):   2.15 cm AV Area (VTI):     2.34 cm AV Vmax:           168.00 cm/s AV Vmean:          112.000 cm/s AV VTI:            0.405 m AV Peak Grad:      11.3 mmHg AV Mean Grad:      6.0 mmHg LVOT Vmax:         117.00 cm/s LVOT Vmean:        76.500 cm/s LVOT VTI:  0.302 m LVOT/AV VTI ratio: 0.75  AORTA Ao Root diam: 3.30 cm MITRAL VALVE MV Area (PHT): 4.10 cm     SHUNTS MV Area VTI:   2.17 cm     Systemic VTI:  0.30 m MV Peak grad:  6.9 mmHg     Systemic Diam: 2.00 cm MV Mean grad:  3.0 mmHg MV Vmax:       1.31 m/s MV Vmean:      76.7 cm/s MV Decel Time: 185 msec MV E velocity: 88.60 cm/s MV A velocity: 115.00 cm/s MV E/A ratio:  0.77 Dwayne D Callwood MD Electronically signed by Cara JONETTA Lovelace MD Signature Date/Time: 04/01/2024/11:57:16 AM    Final    MR BRAIN WO CONTRAST Result Date: 03/31/2024 EXAM: MRI BRAIN WITHOUT CONTRAST 03/31/2024 03:23:44 AM TECHNIQUE: Multiplanar multisequence MRI of the head/brain was performed without the administration of intravenous contrast. COMPARISON: MRI head 05/03/2023 CLINICAL HISTORY: Neuro deficit, acute, stroke suspected FINDINGS: BRAIN AND VENTRICLES: No acute infarct. No intracranial hemorrhage. No mass. No midline shift. No hydrocephalus. The sella is unremarkable. Normal flow voids. ORBITS: No significant abnormality. SINUSES AND MASTOIDS: No significant abnormality. BONES AND SOFT TISSUES: Normal marrow signal. No soft tissue abnormality. IMPRESSION: 1. No acute abnormality. Electronically signed by: Glendia Molt MD 03/31/2024 03:41 AM EST RP Workstation: [id number]   US  Venous Img Lower Bilateral Result Date: 03/30/2024 EXAM: ULTRASOUND DUPLEX OF THE BILATERAL LOWER EXTREMITY VEINS TECHNIQUE: Duplex ultrasound using B-mode/gray scaled imaging and Doppler spectral analysis and color flow was obtained of the deep venous structures of the bilateral lower extremity. COMPARISON: None available. CLINICAL  HISTORY: R > L new swelling, warmth. eval DVT FINDINGS: RIGHT: The common femoral vein, femoral vein, popliteal vein, and posterior tibial vein demonstrate normal compressibility with normal color flow and spectral analysis. 3.7 x 1.3 x 4.0 cm right popliteal fossa cyst. LEFT: The common femoral vein, femoral vein, popliteal vein, and posterior tibial vein demonstrate normal compressibility with normal color flow and spectral analysis. IMPRESSION: 1. No deep venous thrombosis in the bilateral lower extremity. 2. 3.7 x 1.3 x 4.0 cm right popliteal fossa cyst. Electronically signed by: Pinkie Pebbles MD 03/30/2024 11:00 PM EST RP Workstation: [id number]   DG Chest 2 View Result Date: 03/30/2024 EXAM: 2 VIEW(S) XRAY OF THE CHEST 03/30/2024 05:17:00 PM COMPARISON: None available. CLINICAL HISTORY: Chest pain Chest pain chest pain chest pain FINDINGS: LUNGS AND PLEURA: No focal pulmonary opacity. No pleural effusion. No pneumothorax. HEART AND MEDIASTINUM: No acute abnormality of the cardiac and mediastinal silhouettes. BONES AND SOFT TISSUES: No acute osseous abnormality. IMPRESSION: 1. No acute process. Electronically signed by: Franky Crease MD 03/30/2024 05:19 PM EST RP Workstation: [id number]    Time coordinating discharge: 45 mins  SIGNED:  Norval Bar, MD [facility] 04/02/24, 10:58 AM     [1] No Known Allergies  "
